# Patient Record
Sex: Male | Born: 1945 | Race: White | Hispanic: No | Marital: Married | Smoking: Current some day smoker
Health system: Southern US, Community
[De-identification: ages and names within clinical notes are randomized; demographics above are authoritative.]

## PROBLEM LIST (undated history)

## (undated) DIAGNOSIS — E119 Type 2 diabetes mellitus without complications: Secondary | ICD-10-CM

## (undated) DIAGNOSIS — E113299 Type 2 diabetes mellitus with mild nonproliferative diabetic retinopathy without macular edema, unspecified eye: Secondary | ICD-10-CM

## (undated) DIAGNOSIS — G4730 Sleep apnea, unspecified: Secondary | ICD-10-CM

## (undated) DIAGNOSIS — E7800 Pure hypercholesterolemia, unspecified: Secondary | ICD-10-CM

## (undated) DIAGNOSIS — M109 Gout, unspecified: Secondary | ICD-10-CM

## (undated) DIAGNOSIS — I10 Essential (primary) hypertension: Secondary | ICD-10-CM

## (undated) HISTORY — PX: HERNIA REPAIR: SHX51

---

## 2004-12-21 ENCOUNTER — Emergency Department: Payer: Self-pay | Admitting: Emergency Medicine

## 2006-08-28 ENCOUNTER — Emergency Department: Payer: Self-pay | Admitting: Emergency Medicine

## 2009-04-19 ENCOUNTER — Emergency Department: Payer: Self-pay | Admitting: Emergency Medicine

## 2011-05-24 ENCOUNTER — Emergency Department: Payer: Self-pay | Admitting: Emergency Medicine

## 2011-09-27 ENCOUNTER — Emergency Department: Payer: Self-pay | Admitting: Emergency Medicine

## 2011-09-27 LAB — URINALYSIS, COMPLETE
Bacteria: NONE SEEN
Glucose,UR: NEGATIVE mg/dL (ref 0–75)
Ketone: NEGATIVE
Leukocyte Esterase: NEGATIVE
Nitrite: NEGATIVE
Specific Gravity: 1.002 (ref 1.003–1.030)
Squamous Epithelial: NONE SEEN
WBC UR: 1 /HPF (ref 0–5)

## 2011-11-22 ENCOUNTER — Emergency Department: Payer: Self-pay | Admitting: Emergency Medicine

## 2011-11-22 LAB — COMPREHENSIVE METABOLIC PANEL
Albumin: 3.7 g/dL (ref 3.4–5.0)
Alkaline Phosphatase: 54 U/L (ref 50–136)
Anion Gap: 7 (ref 7–16)
BUN: 11 mg/dL (ref 7–18)
Bilirubin,Total: 0.6 mg/dL (ref 0.2–1.0)
EGFR (African American): >60
EGFR (Non-African Amer.): >60
Glucose: 176 mg/dL — ABNORMAL HIGH (ref 65–99)
Potassium: 4 mmol/L (ref 3.5–5.1)
SGOT(AST): 23 U/L (ref 15–37)
SGPT (ALT): 21 U/L
Sodium: 138 mmol/L (ref 136–145)
Total Protein: 8.1 g/dL (ref 6.4–8.2)

## 2011-11-22 LAB — URINALYSIS, COMPLETE
Bacteria: NONE SEEN
Blood: NEGATIVE
Leukocyte Esterase: NEGATIVE
Nitrite: NEGATIVE
Ph: 5 (ref 4.5–8.0)
RBC,UR: <1 /HPF (ref 0–5)
Specific Gravity: 1.019 (ref 1.003–1.030)
WBC UR: <1 /HPF (ref 0–5)

## 2011-11-22 LAB — CBC
HCT: 42.2 % (ref 40.0–52.0)
HGB: 14.4 g/dL (ref 13.0–18.0)
MCH: 31.2 pg (ref 26.0–34.0)
MCHC: 34 g/dL (ref 32.0–36.0)
MCV: 92 fL (ref 80–100)
Platelet: 107 10*3/uL — ABNORMAL LOW (ref 150–440)
RBC: 4.61 10*6/uL (ref 4.40–5.90)
RDW: 13.4 % (ref 11.5–14.5)
WBC: 7.8 10*3/uL (ref 3.8–10.6)

## 2015-12-25 ENCOUNTER — Encounter: Payer: Self-pay | Admitting: Emergency Medicine

## 2015-12-25 ENCOUNTER — Emergency Department
Admission: EM | Admit: 2015-12-25 | Discharge: 2015-12-25 | Disposition: A | Discharge status: Home / Self Care | Payer: Medicare Other | Attending: Student | Admitting: Student

## 2015-12-25 DIAGNOSIS — Y9389 Activity, other specified: Secondary | ICD-10-CM | POA: Insufficient documentation

## 2015-12-25 DIAGNOSIS — S3992XA Unspecified injury of lower back, initial encounter: Secondary | ICD-10-CM | POA: Diagnosis present

## 2015-12-25 DIAGNOSIS — I10 Essential (primary) hypertension: Secondary | ICD-10-CM | POA: Insufficient documentation

## 2015-12-25 DIAGNOSIS — Y929 Unspecified place or not applicable: Secondary | ICD-10-CM | POA: Insufficient documentation

## 2015-12-25 DIAGNOSIS — E119 Type 2 diabetes mellitus without complications: Secondary | ICD-10-CM | POA: Diagnosis not present

## 2015-12-25 DIAGNOSIS — Y999 Unspecified external cause status: Secondary | ICD-10-CM | POA: Diagnosis not present

## 2015-12-25 DIAGNOSIS — Z79899 Other long term (current) drug therapy: Secondary | ICD-10-CM | POA: Insufficient documentation

## 2015-12-25 DIAGNOSIS — Z7984 Long term (current) use of oral hypoglycemic drugs: Secondary | ICD-10-CM | POA: Diagnosis not present

## 2015-12-25 DIAGNOSIS — S39012A Strain of muscle, fascia and tendon of lower back, initial encounter: Secondary | ICD-10-CM | POA: Diagnosis not present

## 2015-12-25 DIAGNOSIS — X58XXXA Exposure to other specified factors, initial encounter: Secondary | ICD-10-CM | POA: Insufficient documentation

## 2015-12-25 HISTORY — DX: Pure hypercholesterolemia, unspecified: E78.00

## 2015-12-25 HISTORY — DX: Essential (primary) hypertension: I10

## 2015-12-25 HISTORY — DX: Gout, unspecified: M10.9

## 2015-12-25 HISTORY — DX: Type 2 diabetes mellitus without complications: E11.9

## 2015-12-25 MED ORDER — ETODOLAC 400 MG PO TABS: 400.0000 mg | ORAL_TABLET | Freq: Two times a day (BID) | ORAL | Status: DC

## 2015-12-25 MED ORDER — KETOROLAC TROMETHAMINE 60 MG/2ML IM SOLN
30.0000 mg | Freq: Once | INTRAMUSCULAR | Status: AC
  Administered 2015-12-25: 30 mg via INTRAMUSCULAR
  Filled 2015-12-25: qty 2

## 2015-12-25 MED ORDER — DIAZEPAM 2 MG PO TABS: 2.0000 mg | ORAL_TABLET | Freq: Three times a day (TID) | ORAL | Status: AC | PRN

## 2015-12-25 NOTE — ED Notes (Signed)
See triage note  States he has been bending over a truck to work on it   Developed lower back pain about 5 days ago  States pain is  Non radiating

## 2015-12-25 NOTE — Discharge Instructions (Signed)
Follow-up with your primary care doctor in [place] if you continue to have problems. Discontinue taking ibuprofen and aspirin at this time. Begin taking etodolac twice a day with food and diazepam every 8 hours as needed for muscle spasms. Do not drive while taking diazepam as it could cause drowsiness. You may use ice or heat to your low back to decrease pain.

## 2015-12-25 NOTE — ED Notes (Signed)
States he has been working on a truck and has been standing in a chair and bending over to reach it, since then having lower back pain.  Ambulates well.

## 2015-12-25 NOTE — ED Provider Notes (Signed)
[facility] Emergency Department Provider Note  ____________________________________________  Time seen: Approximately 7:56 AM  I have reviewed the triage vital signs and the nursing notes.   HISTORY  Chief Complaint Back Pain   HPI Gregory Miller is a 70 y.o. male is here complaining of low back pain. Patient states that last week he was working on a truck. Because this height he is too short to work on it just by bending over. Patient actually was standing on a chair and leaning over the truck while working. Patient denies any previous low back pain. He denies any injury to his back. He has been taking ibuprofen and aspirin with out any relief. He denies any radiation of his pain. Pain is not worse with cough, sneezing or movement. Patient denies being in incontinent of bowel or bladder.Currently patient rates his pain as 8 out of 10.   Past Medical History  Diagnosis Date  . Hypercholesteremia   . Diabetes mellitus without complication (HCC)   . Hypertension   . Gout     There are no active problems to display for this patient.   History reviewed. No pertinent past surgical history.  Current Outpatient Rx  Name  Route  Sig  Dispense  Refill  . colchicine 0.6 MG tablet   Oral   Take 0.6 mg by mouth daily.         . furosemide (LASIX) 20 MG tablet   Oral   Take 20 mg by mouth.         Marland Kitchen. glipiZIDE (GLUCOTROL) 5 MG tablet   Oral   Take 5 mg by mouth daily before breakfast.         . lisinopril (PRINIVIL,ZESTRIL) 2.5 MG tablet   Oral   Take 2.5 mg by mouth daily.         . metFORMIN (GLUCOPHAGE) 1000 MG tablet   Oral   Take 1,000 mg by mouth 2 (two) times daily with a meal.         . simvastatin (ZOCOR) 40 MG tablet   Oral   Take 40 mg by mouth daily.         . diazepam (VALIUM) 2 MG tablet   Oral   Take 1 tablet (2 mg total) by mouth every 8 (eight) hours as needed for muscle spasms.   9 tablet   0   . etodolac (LODINE)  400 MG tablet   Oral   Take 1 tablet (400 mg total) by mouth 2 (two) times daily.   14 tablet   0     Allergies Review of patient's allergies indicates no known allergies.  No family history on file.  Social History Social History  Substance Use Topics  . Smoking status: Never Smoker   . Smokeless tobacco: None  . Alcohol Use: None    Review of Systems Constitutional: No fever/chills Cardiovascular: Denies chest pain. Respiratory: Denies shortness of breath. Gastrointestinal: No abdominal pain.  No nausea, no vomiting.  Genitourinary: Negative for dysuria. Musculoskeletal: Positive back pain. Skin: Negative for rash. Neurological: Negative for headaches, focal weakness or numbness.  10-point ROS otherwise negative.  ____________________________________________   PHYSICAL EXAM:  VITAL SIGNS: ED Triage Vitals  Enc Vitals Group     BP 12/25/15 0718 144/75 mmHg     Pulse Rate 12/25/15 0718 89     Resp 12/25/15 0718 18     Temp 12/25/15 0718 98.1 F (36.7 C)     Temp Source 12/25/15  1191 Oral     SpO2 12/25/15 0718 95 %     Weight 12/25/15 0718 190 lb (86.183 kg)     Height 12/25/15 0718  (1.499 m)     Head Cir --      Peak Flow --      Pain Score 12/25/15 0711 8     Pain Loc --      Pain Edu? --      Excl. in GC? --     Constitutional: Alert and oriented. Well appearing and in no acute distress. Eyes: Conjunctivae are normal. PERRL. EOMI. Head: Atraumatic. Nose: No congestion/rhinnorhea. Neck: No stridor.   Cardiovascular: Normal rate, regular rhythm. Grossly normal heart sounds.  Good peripheral circulation. Respiratory: Normal respiratory effort.  No retractions. Lungs CTAB. Gastrointestinal: Soft and nontender. No distention.  No CVA tenderness. Musculoskeletal: Examination of lower back there is no gross deformity. There is moderate tenderness on palpation of the lumbar spine and paravertebral muscles but no actual vertebral tenderness is noted.  Range of motion is slightly restricted secondary to discomfort and mild muscle spasms. Normal gait was noted. Straight leg raises were 90 with out pain. Neurologic:  Normal speech and language. No gross focal neurologic deficits are appreciated. No gait instability. Reflexes were 2+ bilaterally. Skin:  Skin is warm, dry and intact. No rash noted. Psychiatric: Mood and affect are normal. Speech and behavior are normal.  ____________________________________________   LABS (all labs ordered are listed, but only abnormal results are displayed)  Labs Reviewed - No data to display   PROCEDURES  Procedure(s) performed: None  Critical Care performed: No  ____________________________________________   INITIAL IMPRESSION / ASSESSMENT AND PLAN / ED COURSE  Pertinent labs & imaging results that were available during my care of the patient were reviewed by me and considered in my medical decision making (see chart for details).  The shoulder was given 30 mg Toradol IM while in the emergency room. He was discharged with prescription for etodolac 400 milligrams twice a day with food and diazepam 2 mg every 8 hours as needed for muscle spasms. Patient was encouraged to use ice or heat to his back or possibly even alternating between the 2. He is follow-up with his primary care doctor in [place] if any continued problems. ____________________________________________   FINAL CLINICAL IMPRESSION(S) / ED DIAGNOSES  Final diagnoses:  Lumbar strain, initial encounter      Tommi Rumps, PA-C 12/25/15 4782  Gayla Doss, MD 12/25/15 [phone]

## 2017-01-31 ENCOUNTER — Emergency Department
Admission: EM | Admit: 2017-01-31 | Discharge: 2017-01-31 | Disposition: A | Discharge status: Home / Self Care | Payer: Medicare Other | Attending: Emergency Medicine | Admitting: Emergency Medicine

## 2017-01-31 ENCOUNTER — Encounter: Payer: Self-pay | Admitting: Emergency Medicine

## 2017-01-31 DIAGNOSIS — Z79899 Other long term (current) drug therapy: Secondary | ICD-10-CM | POA: Diagnosis not present

## 2017-01-31 DIAGNOSIS — N4829 Other inflammatory disorders of penis: Secondary | ICD-10-CM

## 2017-01-31 DIAGNOSIS — Z7984 Long term (current) use of oral hypoglycemic drugs: Secondary | ICD-10-CM | POA: Diagnosis not present

## 2017-01-31 DIAGNOSIS — I10 Essential (primary) hypertension: Secondary | ICD-10-CM | POA: Diagnosis not present

## 2017-01-31 DIAGNOSIS — E119 Type 2 diabetes mellitus without complications: Secondary | ICD-10-CM | POA: Diagnosis not present

## 2017-01-31 DIAGNOSIS — F17200 Nicotine dependence, unspecified, uncomplicated: Secondary | ICD-10-CM | POA: Insufficient documentation

## 2017-01-31 DIAGNOSIS — N477 Other inflammatory diseases of prepuce: Secondary | ICD-10-CM | POA: Diagnosis not present

## 2017-01-31 DIAGNOSIS — R109 Unspecified abdominal pain: Secondary | ICD-10-CM | POA: Diagnosis present

## 2017-01-31 NOTE — ED Triage Notes (Signed)
States unable to retract foreskin for washing since yesterday. Mild discomfort. No discharge.

## 2017-01-31 NOTE — ED Provider Notes (Signed)
[facility] Emergency Department Provider Note ____________________________________________  Time seen: 8:01 AM  I have reviewed the triage vital signs and the nursing notes.  HISTORY  Chief Complaint  Groin Pain   HPI Gregory Miller is a 71 y.o. male is here complaining of inability to retract foreskin to clean since yesterday. Patient states there is only mild discomfort and denies any urinary symptoms. He denies any discharge. He states that this occurred while he and his wife for "fooling around". He states this has never happened to him before. Patient's wife used some lidocaine gel to the area which seemed to help. Patient has continued to be able to urinate without any difficulty. Currently he rates his discomfort as 3/10.    Past Medical History:  Diagnosis Date  . Diabetes mellitus without complication (HCC)   . Gout   . Hypercholesteremia   . Hypertension     There are no active problems to display for this patient.   History reviewed. No pertinent surgical history.  Prior to Admission medications   Medication Sig Start Date End Date Taking? Authorizing Provider  colchicine 0.6 MG tablet Take 0.6 mg by mouth daily.    [provider]  diazepam (VALIUM) 2 MG tablet Take 1 tablet (2 mg total) by mouth every 8 (eight) hours as needed for muscle spasms. 12/25/15   Tommi Rumps, PA-C  etodolac (LODINE) 400 MG tablet Take 1 tablet (400 mg total) by mouth 2 (two) times daily. 12/25/15   Tommi Rumps, PA-C  furosemide (LASIX) 20 MG tablet Take 20 mg by mouth.    [provider]  glipiZIDE (GLUCOTROL) 5 MG tablet Take 5 mg by mouth daily before breakfast.    [provider]  lisinopril (PRINIVIL,ZESTRIL) 2.5 MG tablet Take 2.5 mg by mouth daily.    [provider]  metFORMIN (GLUCOPHAGE) 1000 MG tablet Take 1,000 mg by mouth 2 (two) times daily with a meal.    [provider]  simvastatin (ZOCOR) 40 MG  tablet Take 40 mg by mouth daily.    [provider]    Allergies Patient has no known allergies.  No family history on file.  Social History Social History  Substance Use Topics  . Smoking status: Current Some Day Smoker  . Smokeless tobacco: Never Used  . Alcohol use No    Review of Systems  Constitutional: Negative for fever. Cardiovascular: Negative for chest pain. Respiratory: Negative for shortness of breath. Gastrointestinal: Negative for abdominal pain Genitourinary: Negative for dysuria. Foreskin difficulties positive. Musculoskeletal: Negative for back pain. Skin: Negative for rash. Neurological: Negative for  focal weakness or numbness. ____________________________________________  PHYSICAL EXAM:  VITAL SIGNS: ED Triage Vitals  Enc Vitals Group     BP 01/31/17 0726 (!) 149/73     Pulse Rate 01/31/17 0726 94     Resp 01/31/17 0726 20     Temp 01/31/17 0726 98 F (36.7 C)     Temp Source 01/31/17 0726 Oral     SpO2 01/31/17 0726 96 %     Weight 01/31/17 0727 225 lb (102.1 kg)     Height 01/31/17 0727 4\' 11"  (1.499 m)     Head Circumference --      Peak Flow --      Pain Score 01/31/17 0726 3     Pain Loc --      Pain Edu? --      Excl. in GC? --  Constitutional: Alert and oriented. Well appearing and in no distress. Head: Normocephalic and atraumatic. Neck: Supple.  Hematological/Lymphatic/Immunological: No cervical lymphadenopathy. Cardiovascular: Normal rate, regular rhythm. Normal distal pulses. Respiratory: Normal respiratory effort.  Gastrointestinal: Soft and nontender. No distention. Genitourinary:  Distal portion of foreskin is edematous. Foreskin is able to retract enough to see the meatus. No drainage was noted. No necrotic areas were seen. This was also seen by Dr. Darnelle CatalanMalinda. Musculoskeletal: Moves upper and lower extremities without any difficulty. Normal gait was noted.  Neurologic:  Normal gait without ataxia. Normal speech  and language. No gross focal neurologic deficits are appreciated. Skin:  Skin is warm, dry and intact. No rash noted. Psychiatric: Mood and affect are normal. Patient exhibits appropriate insight and judgment.  INITIAL IMPRESSION / ASSESSMENT AND PLAN / ED COURSE  Patient is to allow 2 more days and will for swelling to completely dissipate. He is aware that should his condition worsen he is to return to the emergency room if any fever, increased pain, or inability to urinate. He is to follow-up with his PCP on Tuesday if needed.    ____________________________________________  FINAL CLINICAL IMPRESSION(S) / ED DIAGNOSES  Final diagnoses:  Foreskin inflammation     Tommi RumpsSummers, Rhonda L, PA-C 01/31/17 1425    Arnaldo NatalMalinda, Paul F, MD 01/31/17 1440

## 2017-01-31 NOTE — ED Notes (Signed)
See triage note  States he developed some swelling and pain at the end of penis  Was unable to pull back the foreskin  Wife placed some lidocaine ointment to area   Now end of penis is red and swollen

## 2017-01-31 NOTE — Discharge Instructions (Signed)
Call the  urologist on Tuesday if any continued problems. Return to the emergency room over the weekend if there is any drainage, redness, fever, increased pain or worsening of your condition.

## 2019-06-30 ENCOUNTER — Other Ambulatory Visit: Payer: Self-pay

## 2019-06-30 ENCOUNTER — Emergency Department
Admission: EM | Admit: 2019-06-30 | Discharge: 2019-06-30 | Disposition: A | Discharge status: Home / Self Care | Payer: Medicare Other | Attending: Emergency Medicine | Admitting: Emergency Medicine

## 2019-06-30 ENCOUNTER — Encounter: Payer: Self-pay | Admitting: Emergency Medicine

## 2019-06-30 DIAGNOSIS — N489 Disorder of penis, unspecified: Secondary | ICD-10-CM | POA: Diagnosis present

## 2019-06-30 DIAGNOSIS — N485 Ulcer of penis: Secondary | ICD-10-CM

## 2019-06-30 DIAGNOSIS — N390 Urinary tract infection, site not specified: Secondary | ICD-10-CM | POA: Diagnosis not present

## 2019-06-30 DIAGNOSIS — Z79899 Other long term (current) drug therapy: Secondary | ICD-10-CM | POA: Diagnosis not present

## 2019-06-30 DIAGNOSIS — R319 Hematuria, unspecified: Secondary | ICD-10-CM | POA: Diagnosis not present

## 2019-06-30 DIAGNOSIS — E119 Type 2 diabetes mellitus without complications: Secondary | ICD-10-CM | POA: Diagnosis not present

## 2019-06-30 DIAGNOSIS — Z7984 Long term (current) use of oral hypoglycemic drugs: Secondary | ICD-10-CM | POA: Diagnosis not present

## 2019-06-30 DIAGNOSIS — I10 Essential (primary) hypertension: Secondary | ICD-10-CM | POA: Diagnosis not present

## 2019-06-30 LAB — URINALYSIS, COMPLETE (UACMP) WITH MICROSCOPIC
Bilirubin Urine: NEGATIVE
Glucose, UA: >=500 mg/dL — AB
Ketones, ur: NEGATIVE mg/dL
Nitrite: NEGATIVE
Protein, ur: NEGATIVE mg/dL
RBC / HPF: >50 RBC/hpf — ABNORMAL HIGH (ref 0–5)
Specific Gravity, Urine: 1.012 (ref 1.005–1.030)
WBC, UA: >50 WBC/hpf — ABNORMAL HIGH (ref 0–5)
pH: 5 (ref 5.0–8.0)

## 2019-06-30 MED ORDER — SULFAMETHOXAZOLE-TRIMETHOPRIM 800-160 MG PO TABS: 1.0000 | ORAL_TABLET | Freq: Two times a day (BID) | ORAL | 0 refills | Status: DC

## 2019-06-30 MED ORDER — LIDOCAINE HCL (PF) 1 % IJ SOLN
2.0000 mL | Freq: Once | INTRAMUSCULAR | Status: AC
  Administered 2019-06-30: 2 mL
  Filled 2019-06-30: qty 5

## 2019-06-30 MED ORDER — CEFTRIAXONE SODIUM 250 MG IJ SOLR
250.0000 mg | Freq: Once | INTRAMUSCULAR | Status: AC
  Administered 2019-06-30: 250 mg via INTRAMUSCULAR
  Filled 2019-06-30: qty 250

## 2019-06-30 NOTE — ED Triage Notes (Signed)
Patient reports a "white spot" to the tip of his penis x 2 weeks.  Patient states he can wipe it away but it comes back.  Patient reports some pain with issue.  Patient reports being uncircumcised.  Patient denies unusual penile discharge.

## 2019-06-30 NOTE — ED Provider Notes (Signed)
[facility] Emergency Department Provider Note   ____________________________________________   First MD Initiated Contact with Patient 06/30/19 1029     (approximate)  I have reviewed the triage vital signs and the nursing notes.   HISTORY  Chief Complaint Penis Pain    HPI Gregory Miller is a 73 y.o. male patient presents also lesion to the tip of his penis for 2 weeks.  Patient state he wipes the lesion it goes away but returns in 2 to 3 days.  Patient denies drainage.  Patient denies urethral discharge.  Patient states not sexually active.  Patient rates pain as a 5/10.  Patient described pain as "sore".  No palliative measure for complaint.         Past Medical History:  Diagnosis Date  . Diabetes mellitus without complication (HCC)   . Gout   . Hypercholesteremia   . Hypertension     There are no active problems to display for this patient.   History reviewed. No pertinent surgical history.  Prior to Admission medications   Medication Sig Start Date End Date Taking? Authorizing Provider  colchicine 0.6 MG tablet Take 0.6 mg by mouth daily.    [provider]  diazepam (VALIUM) 2 MG tablet Take 1 tablet (2 mg total) by mouth every 8 (eight) hours as needed for muscle spasms. 12/25/15   Tommi RumpsSummers, Rhonda L, PA-C  etodolac (LODINE) 400 MG tablet Take 1 tablet (400 mg total) by mouth 2 (two) times daily. 12/25/15   Tommi RumpsSummers, Rhonda L, PA-C  furosemide (LASIX) 20 MG tablet Take 20 mg by mouth.    [provider]  glipiZIDE (GLUCOTROL) 5 MG tablet Take 5 mg by mouth daily before breakfast.    [provider]  lisinopril (PRINIVIL,ZESTRIL) 2.5 MG tablet Take 2.5 mg by mouth daily.    [provider]  metFORMIN (GLUCOPHAGE) 1000 MG tablet Take 1,000 mg by mouth 2 (two) times daily with a meal.    [provider]  simvastatin (ZOCOR) 40 MG tablet Take 40 mg by mouth daily.    [provider]   sulfamethoxazole-trimethoprim (BACTRIM DS) 800-160 MG tablet Take 1 tablet by mouth 2 (two) times daily. 06/30/19   Joni ReiningSmith, Race Latour K, PA-C    Allergies Patient has no known allergies.  No family history on file.  Social History Social History   Tobacco Use  . Smoking status: Current Some Day Smoker  . Smokeless tobacco: Never Used  Substance Use Topics  . Alcohol use: No  . Drug use: No    Review of Systems Constitutional: No fever/chills Eyes: No visual changes. ENT: No sore throat. Cardiovascular: Denies chest pain. Respiratory: Denies shortness of breath. Gastrointestinal: No abdominal pain.  No nausea, no vomiting.  No diarrhea.  No constipation. Genitourinary: Negative for dysuria. Musculoskeletal: Negative for back pain. Skin: Negative for rash. Neurological: Negative for headaches, focal weakness or numbness. Endocrine:  Diabetes, gout, hyperlipidemia, and hypertension. ____________________________________________   PHYSICAL EXAM:  VITAL SIGNS: ED Triage Vitals  Enc Vitals Group     BP 06/30/19 1018 (!) 147/92     Pulse Rate 06/30/19 1043 78     Resp 06/30/19 1018 16     Temp 06/30/19 1018 98.6 F (37 C)     Temp Source 06/30/19 1018 Oral     SpO2 06/30/19 1018 97 %     Weight 06/30/19 1019 153 lb (69.4 kg)     Height 06/30/19 1019 4\' 11"  (1.499 m)  Head Circumference --      Peak Flow --      Pain Score 06/30/19 1019 5     Pain Loc --      Pain Edu? --      Excl. in GC? --     Constitutional: Alert and oriented. Well appearing and in no acute distress. Hematological/Lymphatic/Immunilogical: No cervical lymphadenopathy. Cardiovascular: Normal rate, regular rhythm. Grossly normal heart sounds.  Good peripheral circulation. Respiratory: Normal respiratory effort.  No retractions. Lungs CTAB. Musculoskeletal: No lower extremity tenderness nor edema.  No joint effusions. Neurologic:  Normal speech and language. No gross focal neurologic deficits are  appreciated. No gait instability. Skin:  Skin is warm, dry and intact.  Circumcised male with single ulcerative lesion on shaft of penis. Psychiatric: Mood and affect are normal. Speech and behavior are normal.  ____________________________________________   LABS (all labs ordered are listed, but only abnormal results are displayed)  Labs Reviewed  URINALYSIS, COMPLETE (UACMP) WITH MICROSCOPIC - Abnormal; Notable for the following components:      Result Value   Color, Urine YELLOW (*)    APPearance TURBID (*)    Glucose, UA >=500 (*)    Hgb urine dipstick SMALL (*)    Leukocytes,Ua LARGE (*)    RBC / HPF >50 (*)    WBC, UA >50 (*)    Bacteria, UA MANY (*)    All other components within normal limits  URINE CULTURE   ____________________________________________  EKG   ____________________________________________  RADIOLOGY  ED MD interpretation:    Official radiology report(s): No results found.  ____________________________________________   PROCEDURES  Procedure(s) performed (including Critical Care):  Procedures   ____________________________________________   INITIAL IMPRESSION / ASSESSMENT AND PLAN / ED COURSE  As part of my medical decision making, I reviewed the following data within the electronic MEDICAL RECORD NUMBER         AUTHOR HATLESTAD was evaluated in Emergency Department on 06/30/2019 for the symptoms described in the history of present illness. He was evaluated in the context of the global COVID-19 pandemic, which necessitated consideration that the patient might be at risk for infection with the SARS-CoV-2 virus that causes COVID-19. Institutional protocols and algorithms that pertain to the evaluation of patients at risk for COVID-19 are in a state of rapid change based on information released by regulatory bodies including the CDC and federal and state organizations. These policies and algorithms were followed during the patient's care in the ED.   Patient presents with a single ulcer lesion on penis for 2 weeks.  Patient denies sexual activity.  Patient here was consistent with UTI.  Concern for carcinoma.  Patient given prescription for Bactrim DS.  Patient with consulted urology for definitive evaluation.      ____________________________________________   FINAL CLINICAL IMPRESSION(S) / ED DIAGNOSES  Final diagnoses:  Penile ulcer  Urinary tract infection with hematuria, site unspecified     ED Discharge Orders         Ordered    sulfamethoxazole-trimethoprim (BACTRIM DS) 800-160 MG tablet  2 times daily,   Status:  Discontinued     06/30/19 1125    sulfamethoxazole-trimethoprim (BACTRIM DS) 800-160 MG tablet  2 times daily,   Status:  Discontinued     06/30/19 1126    sulfamethoxazole-trimethoprim (BACTRIM DS) 800-160 MG tablet  2 times daily     06/30/19 1127           Note:  This document was prepared using  [employer] education [employer] and may include unintentional dictation errors.    Sable Feil, PA-C 06/30/19 1130    Earleen Newport, MD 06/30/19 1328

## 2019-06-30 NOTE — Discharge Instructions (Signed)
Epithelial lesion needs further evaluation by urologist.

## 2019-07-01 LAB — URINE CULTURE: Special Requests: NORMAL

## 2020-01-08 ENCOUNTER — Emergency Department: Payer: Medicare HMO

## 2020-01-08 ENCOUNTER — Inpatient Hospital Stay
Admission: EM | Admit: 2020-01-08 | Discharge: 2020-01-12 | DRG: 637 | Disposition: A | Discharge status: Home Health | Payer: Medicare HMO | Attending: Internal Medicine | Admitting: Internal Medicine

## 2020-01-08 ENCOUNTER — Other Ambulatory Visit: Payer: Self-pay

## 2020-01-08 ENCOUNTER — Encounter: Payer: Self-pay | Admitting: Emergency Medicine

## 2020-01-08 DIAGNOSIS — E7800 Pure hypercholesterolemia, unspecified: Secondary | ICD-10-CM | POA: Diagnosis present

## 2020-01-08 DIAGNOSIS — M109 Gout, unspecified: Secondary | ICD-10-CM | POA: Diagnosis present

## 2020-01-08 DIAGNOSIS — R739 Hyperglycemia, unspecified: Secondary | ICD-10-CM | POA: Diagnosis present

## 2020-01-08 DIAGNOSIS — E860 Dehydration: Secondary | ICD-10-CM | POA: Diagnosis present

## 2020-01-08 DIAGNOSIS — Z23 Encounter for immunization: Secondary | ICD-10-CM

## 2020-01-08 DIAGNOSIS — R8281 Pyuria: Secondary | ICD-10-CM | POA: Diagnosis present

## 2020-01-08 DIAGNOSIS — Z20822 Contact with and (suspected) exposure to covid-19: Secondary | ICD-10-CM | POA: Diagnosis present

## 2020-01-08 DIAGNOSIS — N390 Urinary tract infection, site not specified: Secondary | ICD-10-CM

## 2020-01-08 DIAGNOSIS — I10 Essential (primary) hypertension: Secondary | ICD-10-CM | POA: Diagnosis present

## 2020-01-08 DIAGNOSIS — G9341 Metabolic encephalopathy: Secondary | ICD-10-CM | POA: Diagnosis present

## 2020-01-08 DIAGNOSIS — E871 Hypo-osmolality and hyponatremia: Secondary | ICD-10-CM | POA: Diagnosis present

## 2020-01-08 DIAGNOSIS — E1100 Type 2 diabetes mellitus with hyperosmolarity without nonketotic hyperglycemic-hyperosmolar coma (NKHHC): Principal | ICD-10-CM | POA: Diagnosis present

## 2020-01-08 DIAGNOSIS — Z7982 Long term (current) use of aspirin: Secondary | ICD-10-CM

## 2020-01-08 DIAGNOSIS — D696 Thrombocytopenia, unspecified: Secondary | ICD-10-CM | POA: Diagnosis not present

## 2020-01-08 DIAGNOSIS — F17290 Nicotine dependence, other tobacco product, uncomplicated: Secondary | ICD-10-CM | POA: Diagnosis present

## 2020-01-08 DIAGNOSIS — E1165 Type 2 diabetes mellitus with hyperglycemia: Secondary | ICD-10-CM | POA: Diagnosis present

## 2020-01-08 DIAGNOSIS — R296 Repeated falls: Secondary | ICD-10-CM | POA: Diagnosis present

## 2020-01-08 DIAGNOSIS — Z7984 Long term (current) use of oral hypoglycemic drugs: Secondary | ICD-10-CM

## 2020-01-08 LAB — BASIC METABOLIC PANEL
Anion gap: 8 (ref 5–15)
Anion gap: 8 (ref 5–15)
Anion gap: 6 (ref 5–15)
BUN: 11 mg/dL (ref 8–23)
BUN: 9 mg/dL (ref 8–23)
BUN: 11 mg/dL (ref 8–23)
CO2: 28 mmol/L (ref 22–32)
CO2: 26 mmol/L (ref 22–32)
CO2: 26 mmol/L (ref 22–32)
Calcium: 8.2 mg/dL — ABNORMAL LOW (ref 8.9–10.3)
Calcium: 8.3 mg/dL — ABNORMAL LOW (ref 8.9–10.3)
Calcium: 8.1 mg/dL — ABNORMAL LOW (ref 8.9–10.3)
Chloride: 93 mmol/L — ABNORMAL LOW (ref 98–111)
Chloride: 99 mmol/L (ref 98–111)
Chloride: 102 mmol/L (ref 98–111)
Creatinine, Ser: 0.81 mg/dL (ref 0.61–1.24)
Creatinine, Ser: 0.76 mg/dL (ref 0.61–1.24)
Creatinine, Ser: 0.68 mg/dL (ref 0.61–1.24)
GFR calc Af Amer: >60 mL/min (ref >60)
GFR calc Af Amer: >60 mL/min (ref >60)
GFR calc Af Amer: >60 mL/min (ref >60)
GFR calc non Af Amer: >60 mL/min (ref >60)
GFR calc non Af Amer: >60 mL/min (ref >60)
GFR calc non Af Amer: >60 mL/min (ref >60)
Glucose, Bld: 538 mg/dL (ref 70–99)
Glucose, Bld: 327 mg/dL — ABNORMAL HIGH (ref 70–99)
Glucose, Bld: 162 mg/dL — ABNORMAL HIGH (ref 70–99)
Potassium: 3.9 mmol/L (ref 3.5–5.1)
Potassium: 3.7 mmol/L (ref 3.5–5.1)
Potassium: 3.5 mmol/L (ref 3.5–5.1)
Sodium: 129 mmol/L — ABNORMAL LOW (ref 135–145)
Sodium: 133 mmol/L — ABNORMAL LOW (ref 135–145)
Sodium: 134 mmol/L — ABNORMAL LOW (ref 135–145)

## 2020-01-08 LAB — CBC
HCT: 37.1 % — ABNORMAL LOW (ref 39.0–52.0)
Hemoglobin: 13.4 g/dL (ref 13.0–17.0)
MCH: 31.7 pg (ref 26.0–34.0)
MCHC: 36.1 g/dL — ABNORMAL HIGH (ref 30.0–36.0)
MCV: 87.7 fL (ref 80.0–100.0)
Platelets: 116 10*3/uL — ABNORMAL LOW (ref 150–400)
RBC: 4.23 MIL/uL (ref 4.22–5.81)
RDW: 12.7 % (ref 11.5–15.5)
WBC: 6.8 10*3/uL (ref 4.0–10.5)
nRBC: 0 % (ref 0.0–0.2)

## 2020-01-08 LAB — URINALYSIS, ROUTINE W REFLEX MICROSCOPIC
Bilirubin Urine: NEGATIVE
Glucose, UA: >=500 mg/dL — AB
Hgb urine dipstick: NEGATIVE
Ketones, ur: NEGATIVE mg/dL
Nitrite: NEGATIVE
Protein, ur: NEGATIVE mg/dL
Specific Gravity, Urine: 1.02 (ref 1.005–1.030)
WBC, UA: >50 WBC/hpf — ABNORMAL HIGH (ref 0–5)
pH: 6 (ref 5.0–8.0)

## 2020-01-08 LAB — URINE DRUG SCREEN, QUALITATIVE (ARMC ONLY)
Amphetamines, Ur Screen: NOT DETECTED
Barbiturates, Ur Screen: NOT DETECTED
Benzodiazepine, Ur Scrn: NOT DETECTED
Cannabinoid 50 Ng, Ur ~~LOC~~: NOT DETECTED
Cocaine Metabolite,Ur ~~LOC~~: NOT DETECTED
MDMA (Ecstasy)Ur Screen: NOT DETECTED
Methadone Scn, Ur: NOT DETECTED
Opiate, Ur Screen: NOT DETECTED
Phencyclidine (PCP) Ur S: NOT DETECTED
Tricyclic, Ur Screen: NOT DETECTED

## 2020-01-08 LAB — DIFFERENTIAL
Abs Immature Granulocytes: 0.02 10*3/uL (ref 0.00–0.07)
Basophils Absolute: 0 10*3/uL (ref 0.0–0.1)
Basophils Relative: 0 %
Eosinophils Absolute: 0.1 10*3/uL (ref 0.0–0.5)
Eosinophils Relative: 2 %
Immature Granulocytes: 0 %
Lymphocytes Relative: 23 %
Lymphs Abs: 1.6 10*3/uL (ref 0.7–4.0)
Monocytes Absolute: 0.3 10*3/uL (ref 0.1–1.0)
Monocytes Relative: 5 %
Neutro Abs: 4.7 10*3/uL (ref 1.7–7.7)
Neutrophils Relative %: 70 %

## 2020-01-08 LAB — COMPREHENSIVE METABOLIC PANEL
ALT: 18 U/L (ref 0–44)
AST: 23 U/L (ref 15–41)
Albumin: 3.2 g/dL — ABNORMAL LOW (ref 3.5–5.0)
Alkaline Phosphatase: 109 U/L (ref 38–126)
Anion gap: 11 (ref 5–15)
BUN: 12 mg/dL (ref 8–23)
CO2: 25 mmol/L (ref 22–32)
Calcium: 8.7 mg/dL — ABNORMAL LOW (ref 8.9–10.3)
Chloride: 90 mmol/L — ABNORMAL LOW (ref 98–111)
Creatinine, Ser: 0.89 mg/dL (ref 0.61–1.24)
GFR calc Af Amer: >60 mL/min (ref >60)
GFR calc non Af Amer: >60 mL/min (ref >60)
Glucose, Bld: 660 mg/dL (ref 70–99)
Potassium: 4.2 mmol/L (ref 3.5–5.1)
Sodium: 126 mmol/L — ABNORMAL LOW (ref 135–145)
Total Bilirubin: 1.5 mg/dL — ABNORMAL HIGH (ref 0.3–1.2)
Total Protein: 7.6 g/dL (ref 6.5–8.1)

## 2020-01-08 LAB — APTT: aPTT: 28 seconds (ref 24–36)

## 2020-01-08 LAB — GLUCOSE, CAPILLARY
Glucose-Capillary: 154 mg/dL — ABNORMAL HIGH (ref 70–99)
Glucose-Capillary: 179 mg/dL — ABNORMAL HIGH (ref 70–99)
Glucose-Capillary: 199 mg/dL — ABNORMAL HIGH (ref 70–99)
Glucose-Capillary: 299 mg/dL — ABNORMAL HIGH (ref 70–99)
Glucose-Capillary: 300 mg/dL — ABNORMAL HIGH (ref 70–99)
Glucose-Capillary: 427 mg/dL — ABNORMAL HIGH (ref 70–99)
Glucose-Capillary: 551 mg/dL (ref 70–99)

## 2020-01-08 LAB — MRSA PCR SCREENING: MRSA by PCR: NEGATIVE

## 2020-01-08 LAB — RESPIRATORY PANEL BY RT PCR (FLU A&B, COVID)
Influenza A by PCR: NEGATIVE
Influenza B by PCR: NEGATIVE
SARS Coronavirus 2 by RT PCR: NEGATIVE

## 2020-01-08 LAB — OSMOLALITY: Osmolality: 299 mOsm/kg — ABNORMAL HIGH (ref 275–295)

## 2020-01-08 LAB — ETHANOL: Alcohol, Ethyl (B): <10 mg/dL (ref <10)

## 2020-01-08 LAB — HEMOGLOBIN A1C
Hgb A1c MFr Bld: 12.9 % — ABNORMAL HIGH (ref 4.8–5.6)
Mean Plasma Glucose: 323.53 mg/dL

## 2020-01-08 LAB — PROTIME-INR
INR: 1.1 (ref 0.8–1.2)
Prothrombin Time: 13.7 seconds (ref 11.4–15.2)

## 2020-01-08 MED ORDER — INSULIN REGULAR(HUMAN) IN NACL 100-0.9 UT/100ML-% IV SOLN
INTRAVENOUS | Status: DC
  Administered 2020-01-08: 14:00:00 14 [IU]/h via INTRAVENOUS
  Filled 2020-01-08: qty 100

## 2020-01-08 MED ORDER — COLCHICINE 0.6 MG PO TABS
0.6000 mg | ORAL_TABLET | Freq: Every day | ORAL | Status: DC
  Administered 2020-01-08 – 2020-01-12 (×5): 0.6 mg via ORAL
  Filled 2020-01-08 (×5): qty 1

## 2020-01-08 MED ORDER — SODIUM CHLORIDE 0.9 % IV SOLN: INTRAVENOUS | Status: DC

## 2020-01-08 MED ORDER — PNEUMOCOCCAL VAC POLYVALENT 25 MCG/0.5ML IJ INJ
0.5000 mL | INJECTION | INTRAMUSCULAR | Status: AC
  Administered 2020-01-09: 0.5 mL via INTRAMUSCULAR
  Filled 2020-01-08: qty 0.5

## 2020-01-08 MED ORDER — SIMVASTATIN 20 MG PO TABS
40.0000 mg | ORAL_TABLET | Freq: Every day | ORAL | Status: DC
  Administered 2020-01-08 – 2020-01-12 (×5): 40 mg via ORAL
  Filled 2020-01-08 (×5): qty 2

## 2020-01-08 MED ORDER — SODIUM CHLORIDE 0.9 % IV SOLN
1.0000 g | Freq: Once | INTRAVENOUS | Status: AC
  Administered 2020-01-08: 12:00:00 1 g via INTRAVENOUS
  Filled 2020-01-08: qty 10

## 2020-01-08 MED ORDER — SODIUM CHLORIDE 0.9% FLUSH
3.0000 mL | Freq: Two times a day (BID) | INTRAVENOUS | Status: DC
  Administered 2020-01-09 – 2020-01-12 (×6): 3 mL via INTRAVENOUS

## 2020-01-08 MED ORDER — INSULIN GLARGINE 100 UNIT/ML ~~LOC~~ SOLN
10.0000 [IU] | Freq: Every evening | SUBCUTANEOUS | Status: DC
  Administered 2020-01-08: 10 [IU] via SUBCUTANEOUS
  Filled 2020-01-08 (×2): qty 0.1

## 2020-01-08 MED ORDER — POLYETHYLENE GLYCOL 3350 17 G PO PACK: 17.0000 g | PACK | Freq: Every day | ORAL | Status: DC | PRN

## 2020-01-08 MED ORDER — SODIUM CHLORIDE 0.9 % IV BOLUS
1000.0000 mL | INTRAVENOUS | Status: AC
  Administered 2020-01-08: 1000 mL via INTRAVENOUS

## 2020-01-08 MED ORDER — POTASSIUM CHLORIDE 10 MEQ/100ML IV SOLN
10.0000 meq | INTRAVENOUS | Status: AC
  Administered 2020-01-08 (×2): 10 meq via INTRAVENOUS
  Filled 2020-01-08 (×2): qty 100

## 2020-01-08 MED ORDER — ONDANSETRON HCL 4 MG PO TABS: 4.0000 mg | ORAL_TABLET | Freq: Four times a day (QID) | ORAL | Status: DC | PRN

## 2020-01-08 MED ORDER — SODIUM CHLORIDE 0.9 % IV SOLN
1000.0000 mL | Freq: Once | INTRAVENOUS | Status: AC
  Administered 2020-01-08: 12:00:00 1000 mL via INTRAVENOUS

## 2020-01-08 MED ORDER — INSULIN ASPART 100 UNIT/ML ~~LOC~~ SOLN
3.0000 [IU] | Freq: Three times a day (TID) | SUBCUTANEOUS | Status: DC
  Administered 2020-01-09 – 2020-01-10 (×4): 3 [IU] via SUBCUTANEOUS
  Filled 2020-01-08 (×3): qty 1

## 2020-01-08 MED ORDER — DEXTROSE-NACL 5-0.45 % IV SOLN: INTRAVENOUS | Status: DC

## 2020-01-08 MED ORDER — ASPIRIN EC 81 MG PO TBEC
81.0000 mg | DELAYED_RELEASE_TABLET | Freq: Every day | ORAL | Status: DC
  Administered 2020-01-08 – 2020-01-12 (×5): 81 mg via ORAL
  Filled 2020-01-08 (×5): qty 1

## 2020-01-08 MED ORDER — ACETAMINOPHEN 325 MG PO TABS: 650.0000 mg | ORAL_TABLET | Freq: Four times a day (QID) | ORAL | Status: DC | PRN

## 2020-01-08 MED ORDER — TAMSULOSIN HCL 0.4 MG PO CAPS
0.4000 mg | ORAL_CAPSULE | Freq: Every day | ORAL | Status: DC
  Administered 2020-01-08 – 2020-01-11 (×4): 0.4 mg via ORAL
  Filled 2020-01-08 (×4): qty 1

## 2020-01-08 MED ORDER — ACETAMINOPHEN 650 MG RE SUPP: 650.0000 mg | Freq: Four times a day (QID) | RECTAL | Status: DC | PRN

## 2020-01-08 MED ORDER — CHLORHEXIDINE GLUCONATE CLOTH 2 % EX PADS
6.0000 | MEDICATED_PAD | Freq: Every day | CUTANEOUS | Status: DC
  Administered 2020-01-08: 14:00:00 6 via TOPICAL

## 2020-01-08 MED ORDER — DEXTROSE 50 % IV SOLN: 0.0000 mL | INTRAVENOUS | Status: DC | PRN

## 2020-01-08 MED ORDER — ONDANSETRON HCL 4 MG/2ML IJ SOLN: 4.0000 mg | Freq: Four times a day (QID) | INTRAMUSCULAR | Status: DC | PRN

## 2020-01-08 MED ORDER — INSULIN ASPART 100 UNIT/ML ~~LOC~~ SOLN
0.0000 [IU] | Freq: Three times a day (TID) | SUBCUTANEOUS | Status: DC
  Administered 2020-01-09: 08:00:00 7 [IU] via SUBCUTANEOUS
  Filled 2020-01-08: qty 1

## 2020-01-08 NOTE — ED Triage Notes (Signed)
Pt has had multiple falls and slurred speech for 2-3 days. Noted slurred speech.  Family reports pt seems drunk and off balance and seems to fall back. Did hit head. No blood thinners.

## 2020-01-08 NOTE — ED Notes (Addendum)
Pt reports feeling weak all over x 1 week, speech clear. Pt's wife reports he has been unsteady on his feet during this time and says he has not been taking his medications the last few days. Pt reports he has not eaten a lot recently but drinks liquids frequently. Wife reports pt has been urinating on himself for a while. PT A&Ox4 and in NAD.

## 2020-01-08 NOTE — ED Notes (Signed)
Pt stood by the side of the bed to use the urinal and needed assistance by this RN to stand without falling over. Pt very shaky and wobbly upon standing. Advised pt and wife to call this RN if he needs assistance to stand.

## 2020-01-08 NOTE — ED Notes (Signed)
Ok per pt to have saltine crackers per Dr. Cyril Loosen

## 2020-01-08 NOTE — ED Provider Notes (Signed)
[facility] Emergency Department Provider Note   ____________________________________________    I have reviewed the triage vital signs and the nursing notes.   HISTORY  Chief Complaint Weakness, confusion    HPI Gregory Miller is a 74 y.o. male with a history of diabetes, hypercholesterolemia, hypertension who presents with weakness, confusion, frequent falls which is been occurring over the last week.  No indication of significant trauma.  Denies fevers.  Some dysuria noted.  No back pain.  No abdominal pain nausea or vomiting.  Has not take anything for this.  Reports compliance with medications.  No weakness in the arms or legs.  Past Medical History:  Diagnosis Date  . Diabetes mellitus without complication (HCC)   . Gout   . Hypercholesteremia   . Hypertension     There are no problems to display for this patient.   History reviewed. No pertinent surgical history.  Prior to Admission medications   Medication Sig Start Date End Date Taking? Authorizing Provider  aspirin 81 MG EC tablet Take by mouth. 12/14/07  Yes [provider]  Cholecalciferol 50 MCG (2000 UT) TABS Take 2,000 Units by mouth daily.    Yes [provider]  diazepam (VALIUM) 2 MG tablet Take 1 tablet (2 mg total) by mouth every 8 (eight) hours as needed for muscle spasms. 12/25/15  Yes Bridget Hartshorn L, PA-C  furosemide (LASIX) 20 MG tablet Take 20 mg by mouth.   Yes [provider]  glipiZIDE (GLUCOTROL) 5 MG tablet Take 10 mg by mouth daily before breakfast.    Yes [provider]  lisinopril (PRINIVIL,ZESTRIL) 2.5 MG tablet Take 2.5 mg by mouth daily.   Yes [provider]  metFORMIN (GLUCOPHAGE) 1000 MG tablet Take 1,000 mg by mouth 2 (two) times daily with a meal.   Yes [provider]  simvastatin (ZOCOR) 40 MG tablet Take 40 mg by mouth daily.   Yes [provider]  tamsulosin (FLOMAX) 0.4 MG CAPS capsule Take  0.4 mg by mouth.   Yes [provider]  colchicine 0.6 MG tablet Take 0.6 mg by mouth daily.    [provider]     Allergies Patient has no known allergies.  History reviewed. No pertinent family history.  Social History Social History   Tobacco Use  . Smoking status: Current Some Day Smoker  . Smokeless tobacco: Never Used  Substance Use Topics  . Alcohol use: No  . Drug use: No    Review of Systems  Constitutional: No fever/chills Eyes: No visual changes.  ENT: No sore throat. Cardiovascular: Denies chest pain. Respiratory: Denies shortness of breath. Gastrointestinal: No abdominal pain.   Genitourinary: As above Musculoskeletal: Negative for back pain. Skin: Negative for rash. Neurological: Negative for headaches   ____________________________________________   PHYSICAL EXAM:  VITAL SIGNS: ED Triage Vitals  Enc Vitals Group     BP 01/08/20 0954 133/77     Pulse Rate 01/08/20 0954 89     Resp 01/08/20 0954 16     Temp 01/08/20 0954 98 F (36.7 C)     Temp Source 01/08/20 0954 Oral     SpO2 01/08/20 0954 93 %     Weight 01/08/20 0942 83.9 kg (185 lb)     Height 01/08/20 0942 1.499 m (4\' 11" )     Head Circumference --      Peak Flow --      Pain Score 01/08/20 0941 0  Pain Loc --      Pain Edu? --      Excl. in [place]? --     Constitutional: Alert and oriented.  Eyes: Conjunctivae are normal.  PERRLA Head: Atraumatic. Nose: No congestion/rhinnorhea. Mouth/Throat: Mucous membranes are dry Neck:  Painless ROM Cardiovascular: Normal rate, regular rhythm.Kermit Balo peripheral circulation. Respiratory: Normal respiratory effort.  No retractions. Lungs CTAB. Gastrointestinal: Soft and nontender. No distention.  No CVA tenderness.  Musculoskeletal: No lower extremity tenderness nor edema.  Warm and well perfused Neurologic:  Normal speech and language. No gross focal neurologic deficits are appreciated.  Moves all extremities well, cranial  nerves intact Skin:  Skin is warm, dry and intact. No rash noted. Psychiatric: Mood and affect are normal. Speech and behavior are normal.  ____________________________________________   LABS (all labs ordered are listed, but only abnormal results are displayed)  Labs Reviewed  CBC - Abnormal; Notable for the following components:      Result Value   HCT 37.1 (*)    MCHC 36.1 (*)    Platelets 116 (*)    All other components within normal limits  COMPREHENSIVE METABOLIC PANEL - Abnormal; Notable for the following components:   Sodium 126 (*)    Chloride 90 (*)    Glucose, Bld 660 (*)    Calcium 8.7 (*)    Albumin 3.2 (*)    Total Bilirubin 1.5 (*)    All other components within normal limits  URINALYSIS, ROUTINE W REFLEX MICROSCOPIC - Abnormal; Notable for the following components:   Color, Urine YELLOW (*)    APPearance HAZY (*)    Glucose, UA >=500 (*)    Leukocytes,Ua LARGE (*)    WBC, UA >50 (*)    Bacteria, UA MANY (*)    All other components within normal limits  URINE CULTURE  ETHANOL  PROTIME-INR  APTT  DIFFERENTIAL  URINE DRUG SCREEN, QUALITATIVE ([facility] ONLY)   ____________________________________________  EKG  ED ECG REPORT I, Lavonia Drafts, the attending physician, personally viewed and interpreted this ECG.  Date: 01/08/2020  Rhythm: normal sinus rhythm QRS Axis: normal Intervals: normal ST/T Wave abnormalities: normal Narrative Interpretation: no evidence of acute ischemia  ____________________________________________  RADIOLOGY  CT head without acute findings ____________________________________________   PROCEDURES  Procedure(s) performed: No  Procedures   Critical Care performed: No ____________________________________________   INITIAL IMPRESSION / ASSESSMENT AND PLAN / ED COURSE  Pertinent labs & imaging results that were available during my care of the patient were reviewed by me and considered in my medical decision making  (see chart for details).  Patient presents with reports of some confusion, weakness, frequent falls over the last week.  Differential includes subdural hematoma, CVA, metabolic cephalopathy, infection.  No evidence of significant trauma on exam.  Cranial nerves and neuro exam is reassuring.  CT scan does not demonstrate any subdural hematoma or epidural hematoma.  Lab work is notable for glucose of 660 with normal anion gap.  White blood cell count is normal.  Urinalysis is consistent with urinary tract infection, will start IV Rocephin.  Combination of elevated glucose and infection may be the underlying cause of his symptoms today.  Will discuss with the hospitalist.    ____________________________________________   FINAL CLINICAL IMPRESSION(S) / ED DIAGNOSES  Final diagnoses:  Hyperglycemia  Urinary tract infection without hematuria, site unspecified  Metabolic encephalopathy        Note:  This document was prepared using Dragon voice recognition software and may include  unintentional dictation errors.   Jene Every, MD 01/08/20 1214

## 2020-01-08 NOTE — H&P (Signed)
History and Physical  LADD CEN LKG:[id number] DOB: July 19, 1946 DOA: 01/08/2020  PCP: Noel Journey, MD   Chief Complaint: Falls at home  HPI:  74 year old man PMH diabetes mellitus type 2 presented to the emergency department with polyuria, polydipsia and multiple falls at home.  Work-up revealed hyperglycemia 660 with normal anion gap and possible UTI.  Referred for admission.  History obtained from the patient and his wife at bedside.  Patient is a diabetic on oral medications.  As of late he has had polyuria and polydipsia.  His appetite has been fine.  His wife notes he has become unsteady on his feet and has had several falls at home and is also has some urinary incontinence.  No specific aggravating or alleviating factors noted.  Given multiple falls, brought to the emergency department for further evaluation.  There is been a question of some confusion as well.  Chart review: . No outpatient records notable in the last year  ED Course: Treated with IV fluids and ceftriaxone  Review of Systems:  Negative for fever, visual changes, sore throat, rash, new muscle aches, chest pain, shortness of breath, dysuria, bleeding, vomiting, abdominal pain, diarrhea.  PMH  Diabetes mellitus type 2   Gout . Remainder reviewed in Epic  [place] . Hernia repair  Family history includes: . Unknown  Social History . No alcohol, no drugs, smokes cigars  Allergies .  Marland Kitchen Remainder reviewed in Epic  Meds include: . None listed  Physicial Exam   Vitals:  . Afebrile 98.2, 18, 85, 141/79  Constitutional:   . Appears calm and comfortable lying on stretcher in the emergency department Eyes:  . Irises appear unremarkable . Normal lids  ENMT:  . grossly normal hearing  . Lips appear normal Neck:  . neck appears normal . no thyromegaly Respiratory:  . CTA bilaterally, no w/r/r.  . Respiratory effort normal.  Cardiovascular:  . RRR, no m/r/g . No LE extremity edema   Abdomen:   . Soft, nontender, nondistended small infraumbilical vertical incision noted well-healed, no hepatomegaly Musculoskeletal:  . RUE, LUE, RLE, LLE   o strength somewhat diminished globally and tone normal, no atrophy, no abnormal movements o No tenderness, masses GU: Uncircumcised penis, scrotum and perineum appear unremarkable skin:  . Chronic skin changes bilateral lower extremities . palpation of skin: no induration or nodules Neurologic:  . Grossly nonfocal Psychiatric:  . Mental status o Mood, affect appropriate o Orientation to self, location, year  I have personally reviewed following labs and imaging studies  Labs:  Marland Kitchen Glucose 660, anion gap 11, total bilirubin 1.5 . Platelets 116, remainder CBC unremarkable . Urinalysis abnormal with greater than 50 WBC, 21-50 RBCs  Imaging studies:   CT head no acute abnormalities  Medical tests:   EKG independently reviewed: Sinus rhythm   ASSESSMENT/PLAN  Hyperglycemic hyperosmolar state with pseudohyponatremia, mild confusion --Bolus IV fluids then start insulin infusion --Check hemoglobin A1c --May need insulin on discharge.  Hold oral medications.  Multiple falls at home, suspected ataxia secondary to hyperglycemia --PT evaluation  Thrombocytopenia --Significance unclear.  Repeat CBC in a.m.  Pyuria, UTI --Check urine culture  DVT prophylaxis: SCDs Code Status: Full Family Communication: wife at bedside Consults called: none    Time spent: 60 minutes  Murray Hodgkins, MD  [facility] Direct contact: see [email or web address]  7PM-7AM contact night coverage as below   1. Check the care team in [facility] and look for a) attending/consulting [facility] provider listed and b) the [place]  team listed 2. Log into [email or web address] and use [place]'s universal password to access. If you do not have the password, please contact the hospital operator. 3. Locate the [facility] provider you are looking for under [facility] and page to a  number that you can be directly reached. 4. If you still have difficulty reaching the provider, please page the [facility] (Director on Call) for the Hospitalists listed on amion for assistance.  Severity of Illness: The appropriate patient status for this patient is OBSERVATION. Observation status is judged to be reasonable and necessary in order to provide the required intensity of service to ensure the patient's safety. The patient's presenting symptoms, physical exam findings, and initial radiographic and laboratory data in the context of their medical condition is felt to place them at decreased risk for further clinical deterioration. Furthermore, it is anticipated that the patient will be medically stable for discharge from the hospital within 2 midnights of admission. The following factors support the patient status of observation.   " The patient's presenting symptoms include polyuria, polydipsia, multiple falls at home. " The physical exam findings include mild confusion but able to answer questions correctly, poor hygiene. " The initial radiographic and laboratory data are notable for hyperglycemia and pyuria.    Status is: Observation  The patient remains OBS appropriate and will d/c before 2 midnights.  Dispo: The patient is from: Home              Anticipated d/c is to: Home              Anticipated d/c date is: 1 day              Patient currently is not medically stable to d/c.        01/08/2020, 1:58 PM   Principal Problem:   Hyperglycemia Active Problems:   Thrombocytopenia (HCC)   Acute lower UTI

## 2020-01-09 ENCOUNTER — Encounter: Payer: Self-pay | Admitting: Family Medicine

## 2020-01-09 DIAGNOSIS — R8281 Pyuria: Secondary | ICD-10-CM

## 2020-01-09 DIAGNOSIS — R739 Hyperglycemia, unspecified: Secondary | ICD-10-CM | POA: Diagnosis not present

## 2020-01-09 DIAGNOSIS — D696 Thrombocytopenia, unspecified: Secondary | ICD-10-CM | POA: Diagnosis not present

## 2020-01-09 LAB — GLUCOSE, CAPILLARY
Glucose-Capillary: 137 mg/dL — ABNORMAL HIGH (ref 70–99)
Glucose-Capillary: 147 mg/dL — ABNORMAL HIGH (ref 70–99)
Glucose-Capillary: 164 mg/dL — ABNORMAL HIGH (ref 70–99)
Glucose-Capillary: 180 mg/dL — ABNORMAL HIGH (ref 70–99)
Glucose-Capillary: 256 mg/dL — ABNORMAL HIGH (ref 70–99)
Glucose-Capillary: 297 mg/dL — ABNORMAL HIGH (ref 70–99)
Glucose-Capillary: 332 mg/dL — ABNORMAL HIGH (ref 70–99)
Glucose-Capillary: 378 mg/dL — ABNORMAL HIGH (ref 70–99)
Glucose-Capillary: 408 mg/dL — ABNORMAL HIGH (ref 70–99)
Glucose-Capillary: 542 mg/dL (ref 70–99)

## 2020-01-09 LAB — URINE CULTURE: Culture: <10000 — AB

## 2020-01-09 LAB — GLUCOSE, RANDOM: Glucose, Bld: 385 mg/dL — ABNORMAL HIGH (ref 70–99)

## 2020-01-09 MED ORDER — CHLORHEXIDINE GLUCONATE CLOTH 2 % EX PADS
6.0000 | MEDICATED_PAD | Freq: Every day | CUTANEOUS | Status: DC
  Administered 2020-01-11 – 2020-01-12 (×2): 6 via TOPICAL

## 2020-01-09 MED ORDER — INSULIN ASPART 100 UNIT/ML ~~LOC~~ SOLN
0.0000 [IU] | Freq: Every day | SUBCUTANEOUS | Status: DC
  Administered 2020-01-11: 22:00:00 2 [IU] via SUBCUTANEOUS
  Filled 2020-01-09 (×2): qty 1

## 2020-01-09 MED ORDER — INSULIN GLARGINE 100 UNIT/ML ~~LOC~~ SOLN
30.0000 [IU] | Freq: Every day | SUBCUTANEOUS | Status: DC
  Administered 2020-01-09: 10:00:00 30 [IU] via SUBCUTANEOUS
  Filled 2020-01-09 (×2): qty 0.3

## 2020-01-09 MED ORDER — INSULIN STARTER KIT- PEN NEEDLES (ENGLISH)
1.0000 | Freq: Once | Status: AC
  Administered 2020-01-09: 10:00:00 1
  Filled 2020-01-09: qty 1

## 2020-01-09 MED ORDER — INSULIN ASPART 100 UNIT/ML ~~LOC~~ SOLN
0.0000 [IU] | Freq: Every day | SUBCUTANEOUS | Status: DC
  Administered 2020-01-09: 22:00:00 5 [IU] via SUBCUTANEOUS

## 2020-01-09 MED ORDER — LIVING WELL WITH DIABETES BOOK
Freq: Once | Status: AC
  Filled 2020-01-09: qty 1

## 2020-01-09 MED ORDER — INSULIN ASPART 100 UNIT/ML ~~LOC~~ SOLN
0.0000 [IU] | Freq: Three times a day (TID) | SUBCUTANEOUS | Status: DC
  Administered 2020-01-09: 8 [IU] via SUBCUTANEOUS
  Administered 2020-01-09: 13:00:00 15 [IU] via SUBCUTANEOUS
  Administered 2020-01-10: 8 [IU] via SUBCUTANEOUS
  Administered 2020-01-10: 15 [IU] via SUBCUTANEOUS
  Administered 2020-01-10: 5 [IU] via SUBCUTANEOUS
  Administered 2020-01-11: 13:00:00 8 [IU] via SUBCUTANEOUS
  Administered 2020-01-11 (×2): 5 [IU] via SUBCUTANEOUS
  Filled 2020-01-09 (×8): qty 1

## 2020-01-09 MED ORDER — INSULIN ASPART 100 UNIT/ML ~~LOC~~ SOLN: 0.0000 [IU] | Freq: Three times a day (TID) | SUBCUTANEOUS | Status: DC

## 2020-01-09 NOTE — Progress Notes (Addendum)
Patient arrived to floor. Vital signs stable. Breathing is even and unlabored. No distress is noted. All safety measures are in place. Per Dr. Myriam Forehand, no need to cardiac telemetry monitoring, order to dc. Dr. Myriam Forehand acknowledges that blood sugar was in the 400s before lunch and confirms that it is okay to wait until dinner to check again. Will continue to monitor. All safety measures are in place.

## 2020-01-09 NOTE — TOC Initial Note (Signed)
Transition of Care [facility]) - Initial/Assessment Note    Patient Details  Name: Gregory Miller MRN: [id number] Date of Birth: 11/02/1945  Transition of Care [facility]) CM/SW Contact:    Allayne Butcher, RN Phone Number: 01/09/2020, 4:22 PM  Clinical Narrative:                 Patient is from home with his wife.  Patient placed under observation for hyperglycemia.  Wife brought patient in for frequent falls at home, his blood glucose was 660 in the emergency room.  Patient is sleeping soundly at this time, RNCM spoke with patient's wife over the phone.  Wife reports that patient is independent and drives.  Wife will try and make it up to the hospital tomorrow if she can find a ride- she does not drive.   RNCM will discuss home health services with patient tomorrow.   Expected Discharge Plan: Home w Home Health Services Barriers to Discharge: Continued Medical Work up   Patient Goals and CMS Choice Patient states their goals for this hospitalization and ongoing recovery are:: wife would like for the patient to get his blood sugar under control CMS Medicare.gov Compare Post Acute Care list provided to:: Patient Choice offered to / list presented to : Patient  Expected Discharge Plan and Services Expected Discharge Plan: Home w Home Health Services   Discharge Planning Services: CM Consult Post Acute Care Choice: Home Health Living arrangements for the past 2 months: Single Family Home                                      Prior Living Arrangements/Services Living arrangements for the past 2 months: Single Family Home Lives with:: Spouse Patient language and need for interpreter reviewed:: Yes Do you feel safe going back to the place where you live?: Yes      Need for Family Participation in Patient Care: Yes (Comment) Care giver support system in place?: Yes (comment) Current home services: DME(cane) Criminal Activity/Legal Involvement Pertinent to Current Situation/Hospitalization:  No - Comment as needed  Activities of Daily Living Home Assistive Devices/Equipment: None ADL Screening (condition at time of admission) Patient's cognitive ability adequate to safely complete daily activities?: Yes Is the patient deaf or have difficulty hearing?: No Does the patient have difficulty seeing, even when wearing glasses/contacts?: No Does the patient have difficulty concentrating, remembering, or making decisions?: No Patient able to express need for assistance with ADLs?: Yes Does the patient have difficulty dressing or bathing?: No Independently performs ADLs?: Yes (appropriate for developmental age) Does the patient have difficulty walking or climbing stairs?: No Weakness of Legs: None Weakness of Arms/Hands: None  Permission Sought/Granted Permission sought to share information with : Case Manager, Family Supports Permission granted to share information with : Yes, Verbal Permission Granted  Share Information with NAME: Dois Davenport     Permission granted to share info w Relationship: wife     Emotional Assessment Appearance:: Appears stated age Attitude/Demeanor/Rapport: Lethargic   Orientation: : Oriented to Self, Oriented to Place, Oriented to  Time, Oriented to Situation Alcohol / Substance Use: Not Applicable Psych Involvement: No (comment)  Admission diagnosis:  Metabolic encephalopathy [G93.41] Hyperglycemia [R73.9] Urinary tract infection without hematuria, site unspecified [N39.0] Patient Active Problem List   Diagnosis Date Noted  . Hyperglycemia 01/08/2020  . Thrombocytopenia (HCC) 01/08/2020  . Acute lower UTI 01/08/2020   PCP:  Lucita Ferrara,  MD Pharmacy:   [id number] [place], [id number]: [phone]: [phone]    Social Determinants of Health (SDOH) Interventions    Readmission Risk Interventions No flowsheet data found.

## 2020-01-09 NOTE — Care Management Obs Status (Signed)
MEDICARE OBSERVATION STATUS NOTIFICATION   Patient Details  Name: Gregory Miller MRN: [id number] Date of Birth: [birth year]   Medicare Observation Status Notification Given:  Yes    Allayne Butcher, RN 01/09/2020, 4:17 PM

## 2020-01-09 NOTE — Progress Notes (Signed)
Alerted Dr. Myriam Forehand that patient has some milky white discharge from penis that is seeping out, moderate amount. Surrounding area is very red and irritated looking. Patient states that it does not hurt.

## 2020-01-09 NOTE — Progress Notes (Signed)
Nurse tech took blood sugar before patient ate dinner, it was 278. Nurse confirmed the ID number on the glucometer matched the patient's Id number, insulin given accordingly. Result never got transferred into epic after docking it several times. Recheck blood sugar to make sure, 297, patient had just eaten. Will pass along to night shift

## 2020-01-09 NOTE — Progress Notes (Addendum)
Progress Note    Gregory Miller  JME:[id number] DOB: Jun 11, 1946  DOA: 01/08/2020 PCP: Noel Journey, MD      Brief Narrative:    Medical records reviewed and are as summarized below:  Gregory Miller is an 74 y.o. male       Assessment/Plan:   Principal Problem:   Hyperglycemia Active Problems:   Thrombocytopenia (HCC)   Pyuria   Type 2 diabetes mellitus with severe hyperglycemia/hyper osmolar hyperglycemic state: Glucose levels are still uncontrolled/very high.  Continue IV fluids overnight since patient is still dehydrated.  Treat with Lantus and NovoLog.  Monitor glucose levels closely and adjust insulin dose accordingly.  It is important to titrate insulin to get glucose levels to persistently <300 prior to discharge.  The importance of using long-term insulin glucose control was discussed.  Patient had initially agreed to insulin but later changed his mind when he met with the diabetic educator.  Abnormal urinalysis/pyuria: Urine culture showed insignificant growth.  No need for antibiotics at this time.  Falls at home: Patient will have to be evaluated by PT prior to discharge.  PT evaluation.  Hyponatremia: Improved  Thrombocytopenia: Repeat CBC tomorrow.  Body mass index is 28.68 kg/m.   Family Communication/Anticipated D/C date and plan/Code Status   DVT prophylaxis: SCDs Code Status: Full code Family Communication: Plan discussed with patient Disposition Plan:    Status is: Observation  The patient will require care spanning > 2 midnights and should be moved to inpatient because: Inpatient level of care appropriate due to severity of illness  Dispo: The patient is from: Home              Anticipated d/c is to: Home              Anticipated d/c date is: 1 day              Patient currently is not medically stable to d/c.           Subjective:   No complaints.  No dysuria, frequent urination, fever or chills.  No chest pain, shortness of  breath, palpitations or dizziness.  Objective:    Vitals:   01/09/20 0400 01/09/20 0500 01/09/20 0700 01/09/20 1417  BP: 123/65 117/66 (!) 108/94 116/84  Pulse: 85 95 83 91  Resp: 17 (!) 24 16   Temp: 98.3 F (36.8 C)  97.9 F (36.6 C) 97.8 F (36.6 C)  TempSrc:   Axillary Oral  SpO2: 94% 97% 96% 97%  Weight:  64.4 kg    Height:       No data found.   Intake/Output Summary (Last 24 hours) at 01/09/2020 1717 Last data filed at 01/09/2020 0800 Gross per 24 hour  Intake 1449.69 ml  Output 700 ml  Net 749.69 ml   Filed Weights   01/08/20 0942 01/08/20 1334 01/09/20 0500  Weight: 83.9 kg 66.4 kg 64.4 kg    Exam:  GEN: NAD SKIN: No rash EYES: EOMI ENT: MMM CV: RRR PULM: CTA B ABD: soft, ND, NT, +BS CNS: AAO x 3, non focal EXT: No edema or tenderness   Data Reviewed:   I have personally reviewed following labs and imaging studies:  Labs: Labs show the following:   Basic Metabolic Panel: Recent Labs  Lab 01/08/20 1000 01/08/20 1000 01/08/20 1403 01/08/20 1403 01/08/20 1659 01/08/20 2228 01/09/20 1245  NA 126*  --  129*  --  133* 134*  --   K 4.2   < >  3.9   < > 3.7 3.5  --   CL 90*  --  93*  --  99 102  --   CO2 25  --  28  --  26 26  --   GLUCOSE 660*  --  538*  --  327* 162* 385*  BUN 12  --  11  --  9 11  --   CREATININE 0.89  --  0.81  --  0.76 0.68  --   CALCIUM 8.7*  --  8.2*  --  8.3* 8.1*  --    < > = values in this interval not displayed.   GFR Estimated Creatinine Clearance: 63.3 mL/min (by C-G formula based on SCr of 0.68 mg/dL). Liver Function Tests: Recent Labs  Lab 01/08/20 1000  AST 23  ALT 18  ALKPHOS 109  BILITOT 1.5*  PROT 7.6  ALBUMIN 3.2*   No results for input(s): LIPASE, AMYLASE in the last 168 hours. No results for input(s): AMMONIA in the last 168 hours. Coagulation profile Recent Labs  Lab 01/08/20 1000  INR 1.1    CBC: Recent Labs  Lab 01/08/20 1000  WBC 6.8  NEUTROABS 4.7  HGB 13.4  HCT 37.1*  MCV  87.7  PLT 116*   Cardiac Enzymes: No results for input(s): CKTOTAL, CKMB, CKMBINDEX, TROPONINI in the last 168 hours. BNP (last 3 results) No results for input(s): PROBNP in the last 8760 hours. CBG: Recent Labs  Lab 01/08/20 2246 01/08/20 2347 01/09/20 0048 01/09/20 0719 01/09/20 1147  GLUCAP 137* 154* 164* 332* 408*   D-Dimer: No results for input(s): DDIMER in the last 72 hours. Hgb A1c: Recent Labs    01/08/20 1403  HGBA1C 12.9*   Lipid Profile: No results for input(s): CHOL, HDL, LDLCALC, TRIG, CHOLHDL, LDLDIRECT in the last 72 hours. Thyroid function studies: No results for input(s): TSH, T4TOTAL, T3FREE, THYROIDAB in the last 72 hours.  Invalid input(s): FREET3 Anemia work up: No results for input(s): VITAMINB12, FOLATE, FERRITIN, TIBC, IRON, RETICCTPCT in the last 72 hours. Sepsis Labs: Recent Labs  Lab 01/08/20 1000  WBC 6.8    Microbiology Recent Results (from the past 240 hour(s))  Urine culture     Status: Abnormal   Collection Time: 01/08/20  9:54 AM   Specimen: Urine, Random  Result Value Ref Range Status   Specimen Description   Final    URINE, RANDOM Performed at [facility], [address] [place], [place] [address]    Special Requests   Final    NONE Performed at [facility], [address], [place], [place] [address]    Culture (A)  Final    <10,000 COLONIES/mL INSIGNIFICANT GROWTH Performed at [facility] [address] [place], [place] [address]    Report Status 01/09/2020 FINAL  Final  Respiratory Panel by RT PCR (Flu A&B, Covid) - Nasopharyngeal Swab     Status: None   Collection Time: 01/08/20 12:24 PM   Specimen: Nasopharyngeal Swab  Result Value Ref Range Status   SARS Coronavirus 2 by RT PCR NEGATIVE NEGATIVE Final    Comment: (NOTE) SARS-CoV-2 target nucleic acids are NOT DETECTED. The SARS-CoV-2 RNA is generally detectable in upper respiratoy specimens during the acute phase of infection.  The lowest concentration of SARS-CoV-2 viral copies this assay can detect is 131 copies/mL. A negative result does not preclude SARS-Cov-2 infection and should not be used as the sole basis for treatment or other patient management decisions. A  negative result may occur with  improper specimen collection/handling, submission of specimen other than nasopharyngeal swab, presence of viral mutation(s) within the areas targeted by this assay, and inadequate number of viral copies (<131 copies/mL). A negative result must be combined with clinical observations, patient history, and epidemiological information. The expected result is Negative. Fact Sheet for Patients:  PinkCheek.be Fact Sheet for Healthcare Providers:  GravelBags.it This test is not yet ap proved or cleared by the [place] FDA and  has been authorized for detection and/or diagnosis of SARS-CoV-2 by FDA under an Emergency Use Authorization (EUA). This EUA will remain  in effect (meaning this test can be used) for the duration of the COVID-19 declaration under Section 564(b)(1) of the Act, 21 U.S.C. section 360bbb-3(b)(1), unless the authorization is terminated or revoked sooner.    Influenza A by PCR NEGATIVE NEGATIVE Final   Influenza B by PCR NEGATIVE NEGATIVE Final    Comment: (NOTE) The Xpert Xpress SARS-CoV-2/FLU/RSV assay is intended as an aid in  the diagnosis of influenza from Nasopharyngeal swab specimens and  should not be used as a sole basis for treatment. Nasal washings and  aspirates are unacceptable for Xpert Xpress SARS-CoV-2/FLU/RSV  testing. Fact Sheet for Patients: PinkCheek.be Fact Sheet for Healthcare Providers: GravelBags.it This test is not yet approved or cleared by the [place] FDA and  has been authorized for detection and/or diagnosis of SARS-CoV-2 by  FDA under an  Emergency Use Authorization (EUA). This EUA will remain  in effect (meaning this test can be used) for the duration of the  Covid-19 declaration under Section 564(b)(1) of the Act, 21  U.S.C. section 360bbb-3(b)(1), unless the authorization is  terminated or revoked. Performed at [facility], [address] [place], [place] [address]   MRSA PCR Screening     Status: None   Collection Time: 01/08/20  1:44 PM   Specimen: Nasopharyngeal  Result Value Ref Range Status   MRSA by PCR NEGATIVE NEGATIVE Final    Comment:        The GeneXpert MRSA Assay (FDA approved for NASAL specimens only), is one component of a comprehensive MRSA colonization surveillance program. It is not intended to diagnose MRSA infection nor to guide or monitor treatment for MRSA infections. Performed at [facility], [address] [place], [place] [address]     Procedures and diagnostic studies:  CT HEAD WO CONTRAST  Result Date: 01/08/2020 CLINICAL DATA:  Multiple falls and slurred speech 2-3 days. Possible stroke. EXAM: CT HEAD WITHOUT CONTRAST TECHNIQUE: Contiguous axial images were obtained from the base of the skull through the vertex without intravenous contrast. COMPARISON:  None. FINDINGS: Brain: Ventricles, cisterns and other CSF spaces are within normal. There is no mass, mass effect, shift of midline structures or acute hemorrhage. No evidence to suggest acute infarction. There is mild chronic ischemic microvascular disease and age related atrophic change. Vascular: No hyperdense vessel or unexpected calcification. Skull: Normal. Negative for fracture or focal lesion. Sinuses/Orbits: No acute finding. Other: None. IMPRESSION: 1.  No acute findings. 2. Mild chronic ischemic microvascular disease and mild age related atrophic change. Electronically Signed   By: Marin Olp M.D.   On: 01/08/2020 10:51    Medications:   . aspirin EC  81 mg Oral Daily  . Chlorhexidine Gluconate  Cloth  6 each Topical Daily  . colchicine  0.6 mg Oral Daily  . insulin aspart  0-15 Units Subcutaneous TID WC  . insulin aspart  0-5  Units Subcutaneous QHS  . insulin aspart  0-5 Units Subcutaneous QHS  . insulin aspart  3 Units Subcutaneous TID WC  . insulin glargine  30 Units Subcutaneous Daily  . simvastatin  40 mg Oral Daily  . sodium chloride flush  3 mL Intravenous Q12H  . tamsulosin  0.4 mg Oral QPC supper   Continuous Infusions: . sodium chloride Stopped (01/09/20 0209)  . sodium chloride 75 mL/hr at 01/09/20 1713  . dextrose 5 % and 0.45% NaCl Stopped (01/09/20 0602)     LOS: 0 days   Quintasia Theroux  [facility]     01/09/2020, 5:17 PM

## 2020-01-09 NOTE — Progress Notes (Addendum)
Inpatient Diabetes Program Recommendations  AACE/ADA: New Consensus Statement on Inpatient Glycemic Control   Target Ranges:  Prepandial:   less than 140 mg/dL      Peak postprandial:   less than 180 mg/dL (1-2 hours)      Critically ill patients:  140 - 180 mg/dL  Results for ASHLY, GOETHE (MRN [id number]) as of 01/09/2020 08:28  Ref. Range 01/09/2020 00:48 01/09/2020 07:19  Glucose-Capillary Latest Ref Range: 70 - 99 mg/dL 164 (H) 332 (H)   Results for JACOREY, DONAWAY (MRN [id number]) as of 01/09/2020 08:28  Ref. Range 01/08/2020 14:34 01/08/2020 15:03 01/08/2020 16:45 01/08/2020 17:43 01/08/2020 18:45 01/08/2020 20:44 01/08/2020 23:47  Glucose-Capillary Latest Ref Range: 70 - 99 mg/dL 551 (HH) 427 (H) 300 (H) 299 (H) 199 (H) 179 (H) 154 (H)  Results for CAIUS, SILBERNAGEL (MRN [id number]) as of 01/09/2020 08:28  Ref. Range 01/08/2020 14:03  Hemoglobin A1C Latest Ref Range: 4.8 - 5.6 % 12.9 (H)  Results for RODRIGO, MCGRANAHAN (MRN [id number]) as of 01/09/2020 08:28  Ref. Range 01/08/2020 10:00  Glucose Latest Ref Range: 70 - 99 mg/dL 660 (HH)   Review of Glycemic Control  Diabetes history: DM2 Outpatient Diabetes medications: Glipizide 10 mg QAM, Metformin 1000 mg BID Current orders for Inpatient glycemic control: Lantus 30 units daily, Novolog 3 units TID with meals, Novolog 0-15 units TID with meals, Novolog 0-5 units QHS  Inpatient Diabetes Program Recommendations:   HbgA1C:  A1C 12.9% on 01/08/20 indicating an average glucose of 324 mg/dl over the past 2-3 months. Anticipate patient will need insulin as an outpatient. Patient states that he does not want to take insulin at home.   NOTE:  Noted consult for Diabetes Coordinator. Chart reviewed. Patient's last office visit with PCP was on 01/25/19 (telephone visit) and last A1C noted in Care Everywhere was 8% on 10/25/18.   Noted initial glucose 660 mg/dl on 01/08/20. Patient was initially started on an insulin drip and has transitioned to SQ insulin. Patient received Lantus 10  units at 23:33 on 01/08/20 and fasting glucose 332 mg/dl today and Lantus was increased to 30 units daily and Novolog correction to moderate scale.    Spoke with patient about diabetes and home regimen for diabetes control. Patient reports being followed by PCP for diabetes management and states he last seen PCP in December 2020 (however, last note by PCP in chart was 01/25/19).  Patient states that he is taking Glipizide and Metformin consistently as prescribed but notes that he has not taken it the past 2-3 days.    Patient reports checking glucose 2 times per day and states that his glucose is usually 30, 25, 19.  When I asked if he experienced hypoglycemia with noted glucose values, patient stated "No. I do not have any issues with low blood sugars that I know of."  Patient states that his glucometer is very old and he would like to get a prescription for a new glucometer at time of discharge.   Asked about what normal glucose values should be and patient states that he does not know what a normal glucose should be.   Discussed A1C results (12.9% on 01/08/20 ) and explained that current A1C indicates an average glucose of 324 mg/dl over the past 2-3 months. Discussed glucose and A1C goals. Discussed importance of checking CBGs and maintaining good CBG control to prevent long-term and short-term complications. Explained how hyperglycemia leads to damage within blood vessels which lead to the common complications  seen with uncontrolled diabetes. Stressed to the patient the importance of improving glycemic control to prevent further complications from uncontrolled diabetes. Discussed impact of nutrition, exercise, stress, sickness, and medications on diabetes control.  Patient states that he follows a carb modified diet at home.  Inquired about taking insulin at home. Patient states he does not want to take insulin at home. Explained that initial glucose 660 mg/dl and with A1C of 12.9%, he will most likely need  insulin to keep DM controlled and prevent further complications from uncontrolled DM. Patient stated "I just can't do it. I can't take insulin." Patient is willing to take other oral DM medications if prescribed at discharge. Patient states that he will call his PCP and make a follow up appointment regarding DM control. Encouraged patient to think about using insulin as an outpatient especially since glucose is back up to 408 mg/dl before lunch today.  Patient has Living Well with DM book and an insulin starter kit at bedside. Encouraged patient to read over the information and I would follow up with him tomorrow.  Patient verbalized understanding of information discussed and reports no further questions at this time related to diabetes.  Thanks, Barnie Alderman, RN, MSN, CDE Diabetes Coordinator Inpatient Diabetes Program [phone] (Team Pager from 8am to 5pm)

## 2020-01-10 DIAGNOSIS — E871 Hypo-osmolality and hyponatremia: Secondary | ICD-10-CM | POA: Diagnosis present

## 2020-01-10 DIAGNOSIS — E1165 Type 2 diabetes mellitus with hyperglycemia: Secondary | ICD-10-CM | POA: Diagnosis present

## 2020-01-10 DIAGNOSIS — Z7982 Long term (current) use of aspirin: Secondary | ICD-10-CM | POA: Diagnosis not present

## 2020-01-10 DIAGNOSIS — R296 Repeated falls: Secondary | ICD-10-CM | POA: Diagnosis present

## 2020-01-10 DIAGNOSIS — F17290 Nicotine dependence, other tobacco product, uncomplicated: Secondary | ICD-10-CM | POA: Diagnosis present

## 2020-01-10 DIAGNOSIS — E1100 Type 2 diabetes mellitus with hyperosmolarity without nonketotic hyperglycemic-hyperosmolar coma (NKHHC): Secondary | ICD-10-CM | POA: Diagnosis present

## 2020-01-10 DIAGNOSIS — Z20822 Contact with and (suspected) exposure to covid-19: Secondary | ICD-10-CM | POA: Diagnosis present

## 2020-01-10 DIAGNOSIS — M109 Gout, unspecified: Secondary | ICD-10-CM | POA: Diagnosis present

## 2020-01-10 DIAGNOSIS — R8281 Pyuria: Secondary | ICD-10-CM | POA: Diagnosis present

## 2020-01-10 DIAGNOSIS — E860 Dehydration: Secondary | ICD-10-CM | POA: Diagnosis present

## 2020-01-10 DIAGNOSIS — G9341 Metabolic encephalopathy: Secondary | ICD-10-CM | POA: Diagnosis present

## 2020-01-10 DIAGNOSIS — Z7984 Long term (current) use of oral hypoglycemic drugs: Secondary | ICD-10-CM | POA: Diagnosis not present

## 2020-01-10 DIAGNOSIS — E7800 Pure hypercholesterolemia, unspecified: Secondary | ICD-10-CM | POA: Diagnosis present

## 2020-01-10 DIAGNOSIS — R739 Hyperglycemia, unspecified: Secondary | ICD-10-CM | POA: Diagnosis not present

## 2020-01-10 DIAGNOSIS — I10 Essential (primary) hypertension: Secondary | ICD-10-CM | POA: Diagnosis present

## 2020-01-10 DIAGNOSIS — Z23 Encounter for immunization: Secondary | ICD-10-CM | POA: Diagnosis present

## 2020-01-10 DIAGNOSIS — D696 Thrombocytopenia, unspecified: Secondary | ICD-10-CM | POA: Diagnosis present

## 2020-01-10 LAB — BASIC METABOLIC PANEL
Anion gap: 7 (ref 5–15)
BUN: 12 mg/dL (ref 8–23)
CO2: 21 mmol/L — ABNORMAL LOW (ref 22–32)
Calcium: 8.1 mg/dL — ABNORMAL LOW (ref 8.9–10.3)
Chloride: 107 mmol/L (ref 98–111)
Creatinine, Ser: 0.63 mg/dL (ref 0.61–1.24)
GFR calc Af Amer: >60 mL/min (ref >60)
GFR calc non Af Amer: >60 mL/min (ref >60)
Glucose, Bld: 271 mg/dL — ABNORMAL HIGH (ref 70–99)
Potassium: 3.7 mmol/L (ref 3.5–5.1)
Sodium: 135 mmol/L (ref 135–145)

## 2020-01-10 LAB — URINALYSIS, COMPLETE (UACMP) WITH MICROSCOPIC
Bilirubin Urine: NEGATIVE
Glucose, UA: >=500 mg/dL — AB
Ketones, ur: NEGATIVE mg/dL
Nitrite: NEGATIVE
Protein, ur: NEGATIVE mg/dL
Specific Gravity, Urine: 1.011 (ref 1.005–1.030)
pH: 5 (ref 5.0–8.0)

## 2020-01-10 LAB — CHLAMYDIA/NGC RT PCR (ARMC ONLY)
Chlamydia Tr: NOT DETECTED
N gonorrhoeae: NOT DETECTED

## 2020-01-10 LAB — GLUCOSE, CAPILLARY
Glucose-Capillary: 278 mg/dL — ABNORMAL HIGH (ref 70–99)
Glucose-Capillary: 290 mg/dL — ABNORMAL HIGH (ref 70–99)
Glucose-Capillary: 361 mg/dL — ABNORMAL HIGH (ref 70–99)
Glucose-Capillary: 235 mg/dL — ABNORMAL HIGH (ref 70–99)

## 2020-01-10 MED ORDER — INSULIN GLARGINE 100 UNIT/ML ~~LOC~~ SOLN
35.0000 [IU] | Freq: Every day | SUBCUTANEOUS | Status: DC
  Administered 2020-01-10: 10:00:00 35 [IU] via SUBCUTANEOUS
  Filled 2020-01-10 (×2): qty 0.35

## 2020-01-10 MED ORDER — SODIUM CHLORIDE 0.9 % IV SOLN
1.0000 g | INTRAVENOUS | Status: DC
  Administered 2020-01-10 – 2020-01-11 (×2): 1 g via INTRAVENOUS
  Filled 2020-01-10: qty 10
  Filled 2020-01-10 (×2): qty 1

## 2020-01-10 MED ORDER — INSULIN ASPART 100 UNIT/ML ~~LOC~~ SOLN
5.0000 [IU] | Freq: Three times a day (TID) | SUBCUTANEOUS | Status: DC
  Administered 2020-01-10: 12:00:00 2 [IU] via SUBCUTANEOUS
  Administered 2020-01-10 – 2020-01-11 (×2): 5 [IU] via SUBCUTANEOUS
  Filled 2020-01-10: qty 1

## 2020-01-10 NOTE — Evaluation (Signed)
Occupational Therapy Evaluation Patient Details Name: Gregory Miller MRN: [id number] DOB: [birth year] Today's Date: 01/10/2020    History of Present Illness Gregory Miller is a 83yoM who comes to [facility] after several days slurred speech, instability of gait, family reports he appears drunk. Pt noted to have elevated BG 600s upon entry.   Clinical Impression   Gregory Miller was seen for OT evaluation this date. Prior to hospital admission, pt was MOD I c SPC c supervision from wife for bathing/LBD and assist for IADLs. Pt presents to acute OT demonstrating impaired ADL performance, functional cognition, and functional mobility 2/2 decreased safety awareness, poor insight into deficits, bladder incontinence, and functional strength/ROM/balance deficits. Pt currently requires MIN A toileting using urinal standing at EOB - asisst for positioning urinal, proper termination, and balance. MIN A LBD seated EOB - assist for balance in sitting. Pt would benefit from skilled OT to address noted impairments and functional limitations (see below for any additional details) in order to maximize safety and independence while minimizing falls risk and caregiver burden. Upon hospital discharge, recommend HHOT to maximize pt safety and return to functional independence during meaningful occupations of daily life.     Follow Up Recommendations  Home health OT;Supervision/Assistance - 24 hour    Equipment Recommendations  3 in 1 bedside commode    Recommendations for Other Services       Precautions / Restrictions Precautions Precautions: Fall Restrictions Weight Bearing Restrictions: No      Mobility Bed Mobility Overal bed mobility: Needs Assistance Bed Mobility: Supine to Sit;Sit to Supine     Supine to sit: Supervision Sit to supine: Supervision      Transfers Overall transfer level: Needs assistance Equipment used: Rolling walker (2 wheeled) Transfers: Sit to/from Stand Sit to Stand: Min guard          General transfer comment: Pt requires VCs for safety and to decrease impulsive movements    Balance Overall balance assessment: Needs assistance;History of Falls Sitting-balance support: No upper extremity supported Sitting balance-Leahy Scale: Good     Standing balance support: Single extremity supported Standing balance-Leahy Scale: Good                             ADL either performed or assessed with clinical judgement   ADL Overall ADL's : Needs assistance/impaired                                       General ADL Comments: Pt requires MIN A toileting using urinal standing at EOB - asisst for positioning urinal and proper termination. MIN A LBD seated EOB - assist for balance in sitting      Vision   Vision Assessment?: Yes Tracking/Visual Pursuits: Requires cues, head turns, or add eye shifts to track;Decreased smoothness of horizontal tracking     Perception     Praxis      Pertinent Vitals/Pain Pain Assessment: No/denies pain     Hand Dominance Right   Extremity/Trunk Assessment Upper Extremity Assessment Upper Extremity Assessment: Generalized weakness(difficulty c finger to nose)   Lower Extremity Assessment Lower Extremity Assessment: Generalized weakness       Communication Communication Communication: HOH   Cognition Arousal/Alertness: Awake/alert Behavior During Therapy: WFL for tasks assessed/performed Overall Cognitive Status: Difficult to assess Area of Impairment: Safety/judgement;Following commands;Orientation;Problem solving  Orientation Level: Disoriented to;Situation     Following Commands: Follows one step commands inconsistently(requires reprtition of commands) Safety/Judgement: Decreased awareness of safety;Decreased awareness of deficits   Problem Solving: Decreased initiation;Difficulty sequencing;Requires tactile cues;Requires verbal cues;Slow processing General Comments: Pt  did not state he had wet the bed until wife/OT noticed and asked. Pt did not state need to urinate until asked (RN reports have been attempting to obtain urine sample and pt has not stated need to go). Per wife, pt has hx of urinary incontinence   General Comments       Exercises Exercises: Other exercises Other Exercises Other Exercises: Pt and caregiver educated re: falls prevention, energy conservation, DME recs, d/c recs, home/routine modification, RW technique Other Exercises: Toileting, bed mobility, sup<>sit, sit<>stand x2, side stepping at EOB   Shoulder Instructions      Home Living Family/patient expects to be discharged to:: Private residence Living Arrangements: Spouse/significant other Available Help at Discharge: Family Type of Home: Mobile home Home Access: Level entry     Home Layout: One level     Bathroom Shower/Tub: Chief Strategy Officer: Standard     Home Equipment: Cane - single point          Prior Functioning/Environment Level of Independence: Needs assistance    ADL's / Homemaking Assistance Needed: Wife reports pt requires assist for IADLs and Supervision for bathing/LBD            OT Problem List: Decreased strength;Decreased range of motion;Impaired balance (sitting and/or standing);Impaired vision/perception;Decreased safety awareness;Decreased knowledge of use of DME or AE      OT Treatment/Interventions: Self-care/ADL training;Therapeutic exercise;Energy conservation;Neuromuscular education;DME and/or AE instruction;Therapeutic activities;Cognitive remediation/compensation;Visual/perceptual remediation/compensation;Balance training;Patient/family education    OT Goals(Current goals can be found in the care plan section) Acute Rehab OT Goals Patient Stated Goal: be safe with moblity at home OT Goal Formulation: With patient/family Time For Goal Achievement: 01/24/20 Potential to Achieve Goals: Good ADL Goals Pt Will  Perform Grooming: with supervision;standing(c LRAD PRN) Pt Will Perform Lower Body Dressing: with supervision;sit to/from stand(c LRAD PRN) Pt Will Transfer to Toilet: with supervision;ambulating;regular height toilet(c LRAD PRN) Pt Will Perform Toileting - Clothing Manipulation and hygiene: with supervision;sitting/lateral leans(c LRAD PRN)  OT Frequency: Min 2X/week   Barriers to D/C: Decreased caregiver support;Inaccessible home environment          Co-evaluation              AM-PAC OT "6 Clicks" Daily Activity     Outcome Measure Help from another person eating meals?: A Little Help from another person taking care of personal grooming?: A Little Help from another person toileting, which includes using toliet, bedpan, or urinal?: A Little Help from another person bathing (including washing, rinsing, drying)?: A Little Help from another person to put on and taking off regular upper body clothing?: A Little Help from another person to put on and taking off regular lower body clothing?: A Little 6 Click Score: 18   End of Session Equipment Utilized During Treatment: Rolling walker  Activity Tolerance: Patient tolerated treatment well Patient left: in bed;with call bell/phone within reach;with bed alarm set;with family/visitor present;with SCD's reapplied  OT Visit Diagnosis: Other abnormalities of gait and mobility (R26.89);Muscle weakness (generalized) (M62.81);History of falling (Z91.81)                Time: 3299-2426 OT Time Calculation (min): 28 min Charges:  OT General Charges $OT Visit: 1 Visit OT Evaluation $OT Eval Moderate Complexity:  1 Mod OT Treatments $Self Care/Home Management : 23-37 mins  Kathie Dike, M.S. OTR/L  01/10/20, 2:56 PM

## 2020-01-10 NOTE — Progress Notes (Addendum)
Progress Note    Gregory Miller  ZYS:[id number] DOB: 16-Feb-1946  DOA: 01/08/2020 PCP: Noel Journey, MD      Brief Narrative:    Medical records reviewed and are as summarized below:  Gregory Miller is an 74 y.o. male       Assessment/Plan:   Principal Problem:   Hyperglycemia Active Problems:   Thrombocytopenia (HCC)   Pyuria   Type 2 diabetes mellitus with hyperglycemia (HCC)   Type 2 diabetes mellitus with severe hyperglycemia/hyper osmolar hyperglycemic state: Glucose levels are still uncontrolled/very high. Continue with Lantus and NovoLog.  Monitor glucose levels closely and adjust insulin dose accordingly. It is important to titrate insulin to get glucose levels to persistently <300 prior to discharge.  The importance of using long-term insulin glucose control was discussed.  Patient has decided against the use of long-term insulin.  His wife was also at the bedside and she said she is not comfortable with insulin.  They understand that oral hypoglycemics may not be adequate for glucose control.  Abnormal urinalysis/pyuria: Urine culture from urinalysis on 01/08/2020 showed insignificant growth.  Purulent looking no milky discharge from penis, patient will be restarted on IV antibiotics.  Repeat urinalysis.  Check gonococcal and chlamydia PCR.  This was discussed with patient and his wife at the bedside.  Falls at home: Recommended home health at discharge.    Hyponatremia: Resolved.  Thrombocytopenia: Repeat CBC .  Body mass index is 28.68 kg/m.   Family Communication/Anticipated D/C date and plan/Code Status   DVT prophylaxis: SCDs Code Status: Full code Family Communication: Plan discussed with his wife at the bedside Disposition Plan:   Status is: Inpatient  Remains inpatient appropriate because:Unsafe d/c plan and IV treatments appropriate due to intensity of illness or inability to take PO   Dispo: The patient is from: Home              Anticipated  d/c is to: Home              Anticipated d/c date is: 1 day              Patient currently is not medically stable to d/c.                   Subjective:   Overnight events noted.  Patient has purulent-looking/milky discharge from his penis.  No dysuria or hematuria.  No fever or chills.  Patient said he is not sexually active.  Objective:    Vitals:   01/09/20 0700 01/09/20 1417 01/09/20 2356 01/10/20 0735  BP: (!) 108/94 116/84 (!) 113/56 116/68  Pulse: 83 91 94 87  Resp: 16  16 16   Temp: 97.9 F (36.6 C) 97.8 F (36.6 C) 98.4 F (36.9 C) 98.9 F (37.2 C)  TempSrc: Axillary Oral Oral Oral  SpO2: 96% 97% 94% 94%  Weight:      Height:       No data found.   Intake/Output Summary (Last 24 hours) at 01/10/2020 1156 Last data filed at 01/10/2020 0900 Gross per 24 hour  Intake 552.59 ml  Output 200 ml  Net 352.59 ml   Filed Weights   01/08/20 0942 01/08/20 1334 01/09/20 0500  Weight: 83.9 kg 66.4 kg 64.4 kg    Exam:  GEN: NAD SKIN: Erythematous maculopapular rash in bilateral groin and around the penis. EYES: No pallor or icterus ENT: MMM CV: RRR PULM: CTA B ABD: soft, ND, NT, +BS  CNS:  AAO x 3, non focal EXT: No edema or tenderness GU: Milky or creamy thick discharge noted from the penis/urethra.  No CVA tenderness   Data Reviewed:   I have personally reviewed following labs and imaging studies:  Labs: Labs show the following:   Basic Metabolic Panel: Recent Labs  Lab 01/08/20 1000 01/08/20 1000 01/08/20 1403 01/08/20 1403 01/08/20 1659 01/08/20 1659 01/08/20 2228 01/09/20 1245 01/10/20 0605  NA 126*  --  129*  --  133*  --  134*  --  135  K 4.2   < > 3.9   < > 3.7   < > 3.5  --  3.7  CL 90*  --  93*  --  99  --  102  --  107  CO2 25  --  28  --  26  --  26  --  21*  GLUCOSE 660*   < > 538*  --  327*  --  162* 385* 271*  BUN 12  --  11  --  9  --  11  --  12  CREATININE 0.89  --  0.81  --  0.76  --  0.68  --  0.63  CALCIUM 8.7*   --  8.2*  --  8.3*  --  8.1*  --  8.1*   < > = values in this interval not displayed.   GFR Estimated Creatinine Clearance: 63.3 mL/min (by C-G formula based on SCr of 0.63 mg/dL). Liver Function Tests: Recent Labs  Lab 01/08/20 1000  AST 23  ALT 18  ALKPHOS 109  BILITOT 1.5*  PROT 7.6  ALBUMIN 3.2*   No results for input(s): LIPASE, AMYLASE in the last 168 hours. No results for input(s): AMMONIA in the last 168 hours. Coagulation profile Recent Labs  Lab 01/08/20 1000  INR 1.1    CBC: Recent Labs  Lab 01/08/20 1000  WBC 6.8  NEUTROABS 4.7  HGB 13.4  HCT 37.1*  MCV 87.7  PLT 116*   Cardiac Enzymes: No results for input(s): CKTOTAL, CKMB, CKMBINDEX, TROPONINI in the last 168 hours. BNP (last 3 results) No results for input(s): PROBNP in the last 8760 hours. CBG: Recent Labs  Lab 01/09/20 1706 01/09/20 1922 01/09/20 2116 01/10/20 0833 01/10/20 1130  GLUCAP 278* 297* 256* 290* 361*   D-Dimer: No results for input(s): DDIMER in the last 72 hours. Hgb A1c: Recent Labs    01/08/20 1403  HGBA1C 12.9*   Lipid Profile: No results for input(s): CHOL, HDL, LDLCALC, TRIG, CHOLHDL, LDLDIRECT in the last 72 hours. Thyroid function studies: No results for input(s): TSH, T4TOTAL, T3FREE, THYROIDAB in the last 72 hours.  Invalid input(s): FREET3 Anemia work up: No results for input(s): VITAMINB12, FOLATE, FERRITIN, TIBC, IRON, RETICCTPCT in the last 72 hours. Sepsis Labs: Recent Labs  Lab 01/08/20 1000  WBC 6.8    Microbiology Recent Results (from the past 240 hour(s))  Urine culture     Status: Abnormal   Collection Time: 01/08/20  9:54 AM   Specimen: Urine, Random  Result Value Ref Range Status   Specimen Description   Final    URINE, RANDOM Performed at [facility], [address] [place], [place] [address]    Special Requests   Final    NONE Performed at [facility], [address] [place], [place] [address]    Culture  (A)  Final    <10,000 COLONIES/mL INSIGNIFICANT GROWTH Performed at [facility], [address], [place], [place] [address]    Report Status 01/09/2020 FINAL  Final  Respiratory Panel by RT PCR (Flu A&B, Covid) - Nasopharyngeal Swab     Status: None   Collection Time: 01/08/20 12:24 PM   Specimen: Nasopharyngeal Swab  Result Value Ref Range Status   SARS Coronavirus 2 by RT PCR NEGATIVE NEGATIVE Final    Comment: (NOTE) SARS-CoV-2 target nucleic acids are NOT DETECTED. The SARS-CoV-2 RNA is generally detectable in upper respiratoy specimens during the acute phase of infection. The lowest concentration of SARS-CoV-2 viral copies this assay can detect is 131 copies/mL. A negative result does not preclude SARS-Cov-2 infection and should not be used as the sole basis for treatment or other patient management decisions. A negative result may occur with  improper specimen collection/handling, submission of specimen other than nasopharyngeal swab, presence of viral mutation(s) within the areas targeted by this assay, and inadequate number of viral copies (<131 copies/mL). A negative result must be combined with clinical observations, patient history, and epidemiological information. The expected result is Negative. Fact Sheet for Patients:  https://www.moore.com/ Fact Sheet for Healthcare Providers:  https://www.young.biz/ This test is not yet ap proved or cleared by the [place] FDA and  has been authorized for detection and/or diagnosis of SARS-CoV-2 by FDA under an Emergency Use Authorization (EUA). This EUA will remain  in effect (meaning this test can be used) for the duration of the COVID-19 declaration under Section 564(b)(1) of the Act, 21 U.S.C. section 360bbb-3(b)(1), unless the authorization is terminated or revoked sooner.    Influenza A by PCR NEGATIVE NEGATIVE Final   Influenza B by PCR NEGATIVE NEGATIVE Final    Comment:  (NOTE) The Xpert Xpress SARS-CoV-2/FLU/RSV assay is intended as an aid in  the diagnosis of influenza from Nasopharyngeal swab specimens and  should not be used as a sole basis for treatment. Nasal washings and  aspirates are unacceptable for Xpert Xpress SARS-CoV-2/FLU/RSV  testing. Fact Sheet for Patients: https://www.moore.com/ Fact Sheet for Healthcare Providers: https://www.young.biz/ This test is not yet approved or cleared by the [place] FDA and  has been authorized for detection and/or diagnosis of SARS-CoV-2 by  FDA under an Emergency Use Authorization (EUA). This EUA will remain  in effect (meaning this test can be used) for the duration of the  Covid-19 declaration under Section 564(b)(1) of the Act, 21  U.S.C. section 360bbb-3(b)(1), unless the authorization is  terminated or revoked. Performed at [facility], [address] [place], [place] [address]   MRSA PCR Screening     Status: None   Collection Time: 01/08/20  1:44 PM   Specimen: Nasopharyngeal  Result Value Ref Range Status   MRSA by PCR NEGATIVE NEGATIVE Final    Comment:        The GeneXpert MRSA Assay (FDA approved for NASAL specimens only), is one component of a comprehensive MRSA colonization surveillance program. It is not intended to diagnose MRSA infection nor to guide or monitor treatment for MRSA infections. Performed at [facility], [address] [place], [place] [address]     Procedures and diagnostic studies:  No results found.  Medications:   . aspirin EC  81 mg Oral Daily  . Chlorhexidine Gluconate Cloth  6 each Topical Daily  . colchicine  0.6 mg Oral Daily  . insulin aspart  0-15 Units Subcutaneous TID WC  . insulin aspart  0-5 Units Subcutaneous QHS  . insulin aspart  3 Units Subcutaneous TID WC  .  insulin glargine  35 Units Subcutaneous Daily  . simvastatin  40 mg Oral Daily  . sodium chloride flush  3 mL  Intravenous Q12H  . tamsulosin  0.4 mg Oral QPC supper   Continuous Infusions: . sodium chloride Stopped (01/09/20 0209)  . sodium chloride 75 mL/hr at 01/10/20 1032  . cefTRIAXone (ROCEPHIN)  IV 1 g (01/10/20 0927)  . dextrose 5 % and 0.45% NaCl Stopped (01/09/20 0602)     LOS: 0 days   Veleria Barnhardt  [facility]     01/10/2020, 11:56 AM

## 2020-01-10 NOTE — Progress Notes (Signed)
Assumed care of patient at this time

## 2020-01-10 NOTE — Evaluation (Signed)
Physical Therapy Evaluation Patient Details Name: Gregory Miller MRN: [id number] DOB: 12/21/1945 Today's Date: 01/10/2020   History of Present Illness  Gregory Miller is a 73yoM who comes to [facility] after several days slurred speech, instability of gait, family reports he appears drunk. Pt noted to have elevated BG 600s upon entry.  Clinical Impression  Pt admitted with above diagnosis. Pt currently with functional limitations due to the deficits listed below (see "PT Problem List"). Upon entry, pt in bed, awake and agreeable to participate. Pt gesturing to IV which has been pulled out of Rt antebrachium, still dangling from arm by tape. Condom cath also pulled off, bed soaked with urine, gown soiled. Pt moves to EOB at supervision, cleaned up blood, new gown, linen stripped from bed. The pt is alert and oriented x4, pleasant, and generally a good historian, but does seem mildly confused at times, unclear if baseline. Pt minGuardA for unsteady rise to standing, but can stand unsupported without LOB with had movements. Gait appears consistent with cerebellar ataxia, wide based, loss of continuity. Pt able to AMB with RW at supervision level, encouraged to use AT ALL TIMES now and after DC. Wife also made aware of this. Seated exam shows abnormal finger to nose, abnormal RAM BUE, and mild dysmetria. Digital opposition with mildly slow and calculated, but patient is able to peel a banana and remove several phloem bundles via pincer gripping. Pt has clear cerebellar signs, but thus far imaging has been unremarkable. Pt denies ever having consumed ETOH in his life, denies any recent consumption. Functional mobility assessment demonstrates increased effort/time requirements, good tolerance, but no need for physical assistance, whereas the patient performed these at a higher level of independence PTA. Pt will benefit from skilled PT intervention to increase independence and safety with basic mobility in preparation for  discharge to the venue listed below.       Follow Up Recommendations Home health PT;Supervision for mobility/OOB    Equipment Recommendations  Rolling walker with 5" wheels    Recommendations for Other Services       Precautions / Restrictions Precautions Precautions: Fall Restrictions Weight Bearing Restrictions: No      Mobility  Bed Mobility Overal bed mobility: Needs Assistance Bed Mobility: Supine to Sit     Supine to sit: Supervision        Transfers Overall transfer level: Needs assistance Equipment used: 1 person hand held assist Transfers: Sit to/from Stand Sit to Stand: Min guard         General transfer comment: unsteady but slow and cautious; able to perform with modified indepence with RW later.  Ambulation/Gait Ambulation/Gait assistance: Min guard Gait Distance (Feet): 15 Feet Assistive device: None Gait Pattern/deviations: Ataxic;Wide base of support Gait velocity: 0.23m/s   General Gait Details: wide based ataxia, irregular cadence, compromised confidence; able to AMB 332ft with RW at minGuard asssit to supervision, cues for directions.  Stairs            Wheelchair Mobility    Modified Rankin (Stroke Patients Only)       Balance Overall balance assessment: Modified Independent;History of Falls;Needs assistance Sitting-balance support: No upper extremity supported Sitting balance-Leahy Scale: Normal     Standing balance support: No upper extremity supported Standing balance-Leahy Scale: Good Standing balance comment: can perform vert and hor headturns without LOB, but has cerebellar ataxia  Pertinent Vitals/Pain Pain Assessment: No/denies pain    Home Living Family/patient expects to be discharged to:: Private residence Living Arrangements: Spouse/significant other Available Help at Discharge: Family Type of Home: Mobile home Home Access: Level entry     Home Layout: One  level Home Equipment: Kasandra Knudsen - single point      Prior Function           Comments: 3 recent falls in the last few weeks.     Hand Dominance        Extremity/Trunk Assessment   Upper Extremity Assessment Upper Extremity Assessment: (poor performance with finger to nose accuracy, mild dysmetria c distal targets; RAM are altered.)            Communication      Cognition Arousal/Alertness: Awake/alert   Overall Cognitive Status: Difficult to assess Area of Impairment: Safety/judgement;Following commands;Orientation                 Orientation Level: Time;Person;Place([place], [place], [facility], Month, Year, Tuesday, (gives current time instead of day of month))     Following Commands: Follows one step commands consistently Safety/Judgement: (had pullied out his IV and off his condom cath upon entry.)            General Comments      Exercises     Assessment/Plan    PT Assessment Patient needs continued PT services  PT Problem List Decreased cognition;Decreased activity tolerance;Decreased balance;Decreased mobility       PT Treatment Interventions DME instruction;Gait training;Functional mobility training;Therapeutic activities;Therapeutic exercise;Patient/family education    PT Goals (Current goals can be found in the Care Plan section)  Acute Rehab PT Goals Patient Stated Goal: be safe with moblity at home PT Goal Formulation: With patient Time For Goal Achievement: 01/24/20 Potential to Achieve Goals: Good    Frequency 7X/week   Barriers to discharge        Co-evaluation               AM-PAC PT "6 Clicks" Mobility  Outcome Measure Help needed turning from your back to your side while in a flat bed without using bedrails?: A Little Help needed moving from lying on your back to sitting on the side of a flat bed without using bedrails?: A Little Help needed moving to and from a bed to a chair (including a wheelchair)?: A  Little Help needed standing up from a chair using your arms (e.g., wheelchair or bedside chair)?: A Little Help needed to walk in hospital room?: A Little Help needed climbing 3-5 steps with a railing? : A Little 6 Click Score: 18    End of Session Equipment Utilized During Treatment: Gait belt Activity Tolerance: Patient tolerated treatment well Patient left: in chair;with family/visitor present;with nursing/sitter in room;with call bell/phone within reach;with SCD's reapplied Nurse Communication: Mobility status PT Visit Diagnosis: Ataxic gait (R26.0);Other abnormalities of gait and mobility (R26.89)    Time: 4540-9811 PT Time Calculation (min) (ACUTE ONLY): 29 min   Charges:   PT Evaluation $PT Eval Low Complexity: 1 Low PT Treatments $Gait Training: 8-22 mins        11:09 AM, 01/10/20 Etta Grandchild, PT, DPT Physical Therapist - [facility]  [phone] ([place])    Ladue C 01/10/2020, 10:58 AM

## 2020-01-10 NOTE — Progress Notes (Addendum)
Inpatient Diabetes Program Recommendations  AACE/ADA: New Consensus Statement on Inpatient Glycemic Control   Target Ranges:  Prepandial:   less than 140 mg/dL      Peak postprandial:   less than 180 mg/dL (1-2 hours)      Critically ill patients:  140 - 180 mg/dL  Results for Gregory Miller, Gregory Miller (MRN [id number]) as of 01/10/2020 07:33  Ref. Range 01/10/2020 06:05  Glucose Latest Ref Range: 70 - 99 mg/dL 271 (H)   Results for Gregory Miller, Gregory Miller (MRN [id number]) as of 01/10/2020 07:33  Ref. Range 01/09/2020 07:19 01/09/2020 11:47 01/09/2020 19:22 01/09/2020 21:16  Glucose-Capillary Latest Ref Range: 70 - 99 mg/dL 332 (H) 408 (H) 297 (H) 256 (H)   Results for Gregory Miller, Gregory Miller (MRN [id number]) as of 01/10/2020 07:33  Ref. Range 01/08/2020 14:03  Hemoglobin A1C Latest Ref Range: 4.8 - 5.6 % 12.9 (H)   Review of Glycemic Control  Diabetes history: DM2 Outpatient Diabetes medications: Glipizide 10 mg QAM, Metformin 1000 mg BID Current orders for Inpatient glycemic control: Lantus 30 units daily, Novolog 3 units TID with meals, Novolog 0-15 units TID with meals, Novolog 0-5 units QHS  Inpatient Diabetes Program Recommendations:   Insulin-Basal: Please consider increasing Lantus to 35 units daily.  Insulin-Meal Coverage: Please consider increasing meal coverage to Novolog 8 units TID with meals.  Addendum 01/10/20@13 :20-Spoke with patient and his wife at bedside. Reviewed information discussed yesterday and patient was able to answer some of the questions asked. Encouraged patient and his wife to read the Living Well with DM book to get a better understanding of diabetes and why it is important to keep it controlled. When asked if patient was willing to take insulin, he stated "I will take it if I have to." Patient's wife stated "I don't want to hear that. I was hoping he wasn't going to need to take shots."  Discussed glucose trends with Lantus, Novolog meal coverage, and correction. Also reviewed A1C of 12.9% on 01/08/20 and  explained that if he is consistently taking oral DM medications prescribed that his average glucose was around 324 mg/dl. Discussed in basic terms how hyperglycemia damages blood vessels and nerve endings and leads to complications. Explained that his pancreas may not be able to make adequate amount of insulin to keep glucose controlled and that if pancreas cells get overworked and die out, they lose the ability to make insulin. Explained that he is most likely going to need insulin injections to keep glucose controlled. Discussed Lantus and Novolog insulin in detail (how they work, onset, and duration). Reviewed insulin starter kit with patient and asked patient to start keeping a log of glucose readings. Discussed hypoglycemia along with treatment and encouraged patient to purchase glucose tablets to have on hand to treat hypoglycemia if needed. Reviewed and demonstrated how to use vial/syringe and insulin pens. Patient states he would prefer to use insulin pens if he has to use insulin at home.   Reviewed all steps of insulin pen including attachment of needle, 2-unit air shot, dialing up dose, giving injection, removing needle, disposal of sharps, storage of unused insulin, disposal of insulin etc. Patient able to provide successful return demonstration with a few verbal cues. Patient's wife was also able to demonstrate how to use an insulin pen with a few verbal cues. Reminded patient and his wife that the insulin starter kit had step by step instructions on how to use the insulin pen. Patient's wife appeared upset about the possibility of patient  having to use insulin and stated "I don't want to have to deal with this shit. I am not use to it and I was just fine with the pills."  Provided emotional support and encouragement.  Patient's wife seems to have a poor understanding of why oral DM medications may not be sufficient to keep DM controlled or the potential complications from uncontrolled DM. Patient  stated that he would be okay with using insulin pens if he absolutely had to. Encouraged patient and his wife to read the Living Well with DM book and the insulin starter kit to enhance their knowledge about DM and insulin administration. Informed patient that I would ask RNs to allow him to self administer insulin while inpatient so he can practice self injecting while inpatient. Patient verbalized understanding of information discussed and he states that he has no questions at this time. Patient's wife stated she did not have any questions but she doesn't want to deal with insulin. Sent chat message to Dr. Mal Misty regarding conversation and education provided to patient and his wife.   Thanks, Barnie Alderman, RN, MSN, CDE Diabetes Coordinator Inpatient Diabetes Program [phone] (Team Pager from 8am to 5pm)

## 2020-01-10 NOTE — Progress Notes (Signed)
Having trouble getting urine sample as condomn cath does not fit him. Will continue to try to get sample.

## 2020-01-11 LAB — BASIC METABOLIC PANEL
Anion gap: 6 (ref 5–15)
BUN: 13 mg/dL (ref 8–23)
CO2: 25 mmol/L (ref 22–32)
Calcium: 8.5 mg/dL — ABNORMAL LOW (ref 8.9–10.3)
Chloride: 104 mmol/L (ref 98–111)
Creatinine, Ser: 0.64 mg/dL (ref 0.61–1.24)
GFR calc Af Amer: >60 mL/min (ref >60)
GFR calc non Af Amer: >60 mL/min (ref >60)
Glucose, Bld: 203 mg/dL — ABNORMAL HIGH (ref 70–99)
Potassium: 3.6 mmol/L (ref 3.5–5.1)
Sodium: 135 mmol/L (ref 135–145)

## 2020-01-11 LAB — GLUCOSE, CAPILLARY
Glucose-Capillary: 179 mg/dL — ABNORMAL HIGH (ref 70–99)
Glucose-Capillary: 241 mg/dL — ABNORMAL HIGH (ref 70–99)
Glucose-Capillary: 266 mg/dL — ABNORMAL HIGH (ref 70–99)
Glucose-Capillary: 218 mg/dL — ABNORMAL HIGH (ref 70–99)
Glucose-Capillary: 213 mg/dL — ABNORMAL HIGH (ref 70–99)

## 2020-01-11 LAB — URINE CULTURE: Culture: <10000 — AB

## 2020-01-11 MED ORDER — GLIPIZIDE 10 MG PO TABS
10.0000 mg | ORAL_TABLET | Freq: Two times a day (BID) | ORAL | Status: DC
  Administered 2020-01-12: 10 mg via ORAL
  Filled 2020-01-11: qty 1

## 2020-01-11 MED ORDER — GLIPIZIDE 10 MG PO TABS
10.0000 mg | ORAL_TABLET | Freq: Every day | ORAL | Status: DC
  Administered 2020-01-11: 10 mg via ORAL
  Filled 2020-01-11: qty 1

## 2020-01-11 MED ORDER — LINAGLIPTIN 5 MG PO TABS
5.0000 mg | ORAL_TABLET | Freq: Every day | ORAL | Status: DC
  Administered 2020-01-11 – 2020-01-12 (×2): 5 mg via ORAL
  Filled 2020-01-11 (×2): qty 1

## 2020-01-11 MED ORDER — INSULIN ASPART 100 UNIT/ML ~~LOC~~ SOLN: 8.0000 [IU] | Freq: Three times a day (TID) | SUBCUTANEOUS | Status: DC

## 2020-01-11 MED ORDER — INSULIN GLARGINE 100 UNIT/ML ~~LOC~~ SOLN
38.0000 [IU] | Freq: Every day | SUBCUTANEOUS | Status: DC
  Filled 2020-01-11: qty 0.38

## 2020-01-11 MED ORDER — INSULIN GLARGINE 100 UNIT/ML ~~LOC~~ SOLN
45.0000 [IU] | Freq: Every day | SUBCUTANEOUS | Status: DC
  Filled 2020-01-11 (×2): qty 0.45

## 2020-01-11 MED ORDER — INSULIN GLARGINE 100 UNIT/ML ~~LOC~~ SOLN: 45.0000 [IU] | Freq: Every day | SUBCUTANEOUS | Status: DC

## 2020-01-11 MED ORDER — METFORMIN HCL 500 MG PO TABS
1000.0000 mg | ORAL_TABLET | Freq: Two times a day (BID) | ORAL | Status: DC
  Administered 2020-01-11 – 2020-01-12 (×2): 1000 mg via ORAL
  Filled 2020-01-11 (×5): qty 2

## 2020-01-11 NOTE — Hospital Course (Signed)
74 year old man PMH diabetes mellitus type 2 presented to the emergency department with polyuria, polydipsia and multiple falls at home.  Work-up revealed hyperglycemia 660 with normal anion gap and possible UTI.  Referred for admission.  Patient takes only oral medications for diabetes, had increased polyuria and polydipsia for some time.  Brought to the ED due to unsteady gait and multiple falls at home.

## 2020-01-11 NOTE — Progress Notes (Addendum)
Inpatient Diabetes Program Recommendations  AACE/ADA: New Consensus Statement on Inpatient Glycemic Control   Target Ranges:  Prepandial:   less than 140 mg/dL      Peak postprandial:   less than 180 mg/dL (1-2 hours)      Critically ill patients:  140 - 180 mg/dL  Results for DUAN, SCHARNHORST (MRN [id number]) as of 01/11/2020 07:43  Ref. Range 01/11/2020 05:31  Glucose Latest Ref Range: 70 - 99 mg/dL 628 (H)   Results for VIAN, FLUEGEL (MRN [id number]) as of 01/11/2020 07:43  Ref. Range 01/10/2020 08:33 01/10/2020 11:30 01/10/2020 16:31 01/10/2020 21:28  Glucose-Capillary Latest Ref Range: 70 - 99 mg/dL 579 (H)  Novolog 8 units  Lantus 35 units 361 (H)  Novolog 20 units 235 (H)  Novolog 10 units 179 (H)   Review of Glycemic Control  Diabetes history:DM2 Outpatient Diabetes medications:Glipizide 10 mg QAM, Metformin 1000 mg BID Current orders for Inpatient glycemic control:Lantus35units daily, Novolog 5 units TID with meals, Novolog 0-15units TID with meals, Novolog 0-5 units QHS  Inpatient Diabetes Program Recommendations:   Insulin-Basal: Please consider increasing Lantus to 38 units daily.  Insulin-Meal Coverage: Please consider increasing meal coverage to Novolog 8 units TID with meals.  Thanks, Orlando Penner, RN, MSN, CDE Diabetes Coordinator Inpatient Diabetes Program [phone] (Team Pager from 8am to 5pm)

## 2020-01-11 NOTE — Progress Notes (Signed)
PROGRESS NOTE    Gregory Miller   XTK:[id number]  DOB: [birth year]  PCP: Lucita Ferrara, MD    DOA: 01/08/2020 LOS: 1   Brief Narrative   74 year old man PMH diabetes mellitus type 2 presented to the emergency department with polyuria, polydipsia and multiple falls at home.  Work-up revealed hyperglycemia 660 with normal anion gap and possible UTI.  Referred for admission.  Patient takes only oral medications for diabetes, had increased polyuria and polydipsia for some time.  Brought to the ED due to unsteady gait and multiple falls at home.       Assessment & Plan   Principal Problem:   Hyperglycemia Active Problems:   Thrombocytopenia (HCC)   Pyuria   Type 2 diabetes mellitus with hyperglycemia (HCC)  Hyperglycemia due to hyperosmolar hyperglycemic state: present on admission Type 2 diabetes mellitus -uncontrolled, hemoglobin A1c 12.9%.  Home regimen appears to be glipizide 10 mg, Metformin 1000 mg twice daily.  Patient and wife are not comfortable using insulin at home due to safety.  They had agreed to try insulin, however during education and training this admission, it has become clear insulin would be unsafe for home use at this time. Patient is unable to follow repeated verbal instructions and assistance from his nurse while giving insulin. Continues to have sugars in 200-300's.  Will be very difficult to adequately control without insulin but it seems patient would require placement at SNF or assisted living to safely use insulin in the outpatient setting. --Diabetes coordinator following --Will transition to completely oral regimen --Metformin 1000 g twice daily --Increase glipizide to 10 mg twice daily (from once daily) --Add Tradjenta 5 mg daily --DM coordinator reccomends Prandin 1 mg TID WC - not on formulary here  Abnormal urinalysis/pyuria: Urine culture from urinalysis on 01/08/2020 showed insignificant growth.  Purulent looking no milky discharge from penis, but  negative for gonorrhea and chlamydia by PCR.  Stop antibiotics.  Falls at home: PT recommended home health at discharge.    Hyponatremia: Resolved.  Thrombocytopenia: Repeat CBC .  Patient BMI: Body mass index is 28.68 kg/m.   DVT prophylaxis: SCD's  Diet:  Diet Orders (From admission, onward)    Start     Ordered   01/08/20 1345  Diet Carb Modified Fluid consistency: Thin; Room service appropriate? Yes  (Hyperglycemic Hyperosmolar State (HHS))  Diet effective now    Question Answer Comment  Diet-HS Snack? Nothing   Calorie Level Medium 1600-2000   Fluid consistency: Thin   Room service appropriate? Yes      01/08/20 1344            Code Status: Full Code    Subjective 01/11/20    Patient seen at bedside.  Wife not present.  Patient reports he feels good.  Asks if he can go home today.  Denies fevers or chills, chest pain or shortness of breath, nausea vomiting or other complaints.  Spoke with patient's nurse, who reported concerns with safety of patient using insulin at home.  She reported he is unable to follow her repeated instructions at bedside during teaching and education, while attempting to give himself insulin.  Multiple times he almost stuck himself with the needle, he does not watch while he is injecting himself and pulls the needle out before all the insulin is injected.     Disposition Plan & Communication   Status is: Inpatient  Remains inpatient appropriate because:his diabetes regimen is being adjusted and he continues to be hyperglycemic.  Patient presented is HHS, has Hbg A1c 12.9% but seems to be unsafe to self-administer insulin and wife also uncomfortable with it.     Dispo: The patient is from: Home              Anticipated d/c is to: Home              Anticipated d/c date is: 1 day              Patient currently is not medically stable to d/c.    Family Communication: none at bedside, will attempt to call    Consults, Procedures,  Significant Events   Consultants:   None  Procedures:   None  Antimicrobials:   Rocephin    Objective   Vitals:   01/10/20 0735 01/10/20 1634 01/10/20 2328 01/11/20 0819  BP: 116/68 104/76 134/84 125/76  Pulse: 87 84 83 89  Resp: 16 16 17 17   Temp: 98.9 F (37.2 C) 98.7 F (37.1 C) 98.2 F (36.8 C) 98.5 F (36.9 C)  TempSrc: Oral  Oral   SpO2: 94% 93% 95% 94%  Weight:      Height:        Intake/Output Summary (Last 24 hours) at 01/11/2020 1444 Last data filed at 01/11/2020 1413 Gross per 24 hour  Intake 360 ml  Output 1200 ml  Net -840 ml   Filed Weights   01/08/20 0942 01/08/20 1334 01/09/20 0500  Weight: 83.9 kg 66.4 kg 64.4 kg    Physical Exam:  General exam: awake, alert, no acute distress, disheveled appearing HEENT: moist mucus membranes, hearing grossly normal  Respiratory system: CTAB, no wheezes, rales or rhonchi, normal respiratory effort. Cardiovascular system: normal S1/S2, RRR, no JVD, murmurs, rubs, gallops, no pedal edema.   Gastrointestinal system: soft, NT, ND, no HSM felt, +bowel sounds. Central nervous system: A&O x3. no gross focal neurologic deficits, normal speech Extremities: moves all, no edema, normal tone Psychiatry: normal mood, congruent affect  Labs   Data Reviewed: I have personally reviewed following labs and imaging studies  CBC: Recent Labs  Lab 01/08/20 1000  WBC 6.8  NEUTROABS 4.7  HGB 13.4  HCT 37.1*  MCV 87.7  PLT 734*   Basic Metabolic Panel: Recent Labs  Lab 01/08/20 1403 01/08/20 1403 01/08/20 1659 01/08/20 2228 01/09/20 1245 01/10/20 0605 01/11/20 0531  NA 129*  --  133* 134*  --  135 135  K 3.9  --  3.7 3.5  --  3.7 3.6  CL 93*  --  99 102  --  107 104  CO2 28  --  26 26  --  21* 25  GLUCOSE 538*   < > 327* 162* 385* 271* 203*  BUN 11  --  9 11  --  12 13  CREATININE 0.81  --  0.76 0.68  --  0.63 0.64  CALCIUM 8.2*  --  8.3* 8.1*  --  8.1* 8.5*   < > = values in this interval not displayed.    GFR: Estimated Creatinine Clearance: 63.3 mL/min (by C-G formula based on SCr of 0.64 mg/dL). Liver Function Tests: Recent Labs  Lab 01/08/20 1000  AST 23  ALT 18  ALKPHOS 109  BILITOT 1.5*  PROT 7.6  ALBUMIN 3.2*   No results for input(s): LIPASE, AMYLASE in the last 168 hours. No results for input(s): AMMONIA in the last 168 hours. Coagulation Profile: Recent Labs  Lab 01/08/20 1000  INR 1.1   Cardiac Enzymes:  No results for input(s): CKTOTAL, CKMB, CKMBINDEX, TROPONINI in the last 168 hours. BNP (last 3 results) No results for input(s): PROBNP in the last 8760 hours. HbA1C: No results for input(s): HGBA1C in the last 72 hours. CBG: Recent Labs  Lab 01/10/20 1130 01/10/20 1631 01/10/20 2053 01/11/20 0819 01/11/20 1213  GLUCAP 361* 235* 179* 241* 266*   Lipid Profile: No results for input(s): CHOL, HDL, LDLCALC, TRIG, CHOLHDL, LDLDIRECT in the last 72 hours. Thyroid Function Tests: No results for input(s): TSH, T4TOTAL, FREET4, T3FREE, THYROIDAB in the last 72 hours. Anemia Panel: No results for input(s): VITAMINB12, FOLATE, FERRITIN, TIBC, IRON, RETICCTPCT in the last 72 hours. Sepsis Labs: No results for input(s): PROCALCITON, LATICACIDVEN in the last 168 hours.  Recent Results (from the past 240 hour(s))  Urine culture     Status: Abnormal   Collection Time: 01/08/20  9:54 AM   Specimen: Urine, Random  Result Value Ref Range Status   Specimen Description   Final    URINE, RANDOM Performed at [facility], [address] [place], [place] [address]    Special Requests   Final    NONE Performed at [facility], [address] [place], [place] [address]    Culture (A)  Final    <10,000 COLONIES/mL INSIGNIFICANT GROWTH Performed at [facility], [address] [place], [place] [address]    Report Status 01/09/2020 FINAL  Final  Respiratory Panel by RT PCR (Flu A&B, Covid) - Nasopharyngeal Swab     Status: None    Collection Time: 01/08/20 12:24 PM   Specimen: Nasopharyngeal Swab  Result Value Ref Range Status   SARS Coronavirus 2 by RT PCR NEGATIVE NEGATIVE Final    Comment: (NOTE) SARS-CoV-2 target nucleic acids are NOT DETECTED. The SARS-CoV-2 RNA is generally detectable in upper respiratoy specimens during the acute phase of infection. The lowest concentration of SARS-CoV-2 viral copies this assay can detect is 131 copies/mL. A negative result does not preclude SARS-Cov-2 infection and should not be used as the sole basis for treatment or other patient management decisions. A negative result may occur with  improper specimen collection/handling, submission of specimen other than nasopharyngeal swab, presence of viral mutation(s) within the areas targeted by this assay, and inadequate number of viral copies (<131 copies/mL). A negative result must be combined with clinical observations, patient history, and epidemiological information. The expected result is Negative. Fact Sheet for Patients:  https://www.moore.com/ Fact Sheet for Healthcare Providers:  https://www.young.biz/ This test is not yet ap proved or cleared by the [place] FDA and  has been authorized for detection and/or diagnosis of SARS-CoV-2 by FDA under an Emergency Use Authorization (EUA). This EUA will remain  in effect (meaning this test can be used) for the duration of the COVID-19 declaration under Section 564(b)(1) of the Act, 21 U.S.C. section 360bbb-3(b)(1), unless the authorization is terminated or revoked sooner.    Influenza A by PCR NEGATIVE NEGATIVE Final   Influenza B by PCR NEGATIVE NEGATIVE Final    Comment: (NOTE) The Xpert Xpress SARS-CoV-2/FLU/RSV assay is intended as an aid in  the diagnosis of influenza from Nasopharyngeal swab specimens and  should not be used as a sole basis for treatment. Nasal washings and  aspirates are unacceptable for Xpert Xpress  SARS-CoV-2/FLU/RSV  testing. Fact Sheet for Patients: https://www.moore.com/ Fact Sheet for Healthcare Providers: https://www.young.biz/ This test is not yet approved or cleared by the [place] FDA and  has been authorized for detection  and/or diagnosis of SARS-CoV-2 by  FDA under an Emergency Use Authorization (EUA). This EUA will remain  in effect (meaning this test can be used) for the duration of the  Covid-19 declaration under Section 564(b)(1) of the Act, 21  U.S.C. section 360bbb-3(b)(1), unless the authorization is  terminated or revoked. Performed at [facility], [address] [place], [place] [address]   MRSA PCR Screening     Status: None   Collection Time: 01/08/20  1:44 PM   Specimen: Nasopharyngeal  Result Value Ref Range Status   MRSA by PCR NEGATIVE NEGATIVE Final    Comment:        The GeneXpert MRSA Assay (FDA approved for NASAL specimens only), is one component of a comprehensive MRSA colonization surveillance program. It is not intended to diagnose MRSA infection nor to guide or monitor treatment for MRSA infections. Performed at [facility], [address] [place], [place] [address]   Chlamydia/NGC rt PCR [facility] only)     Status: None   Collection Time: 01/10/20  2:00 PM   Specimen: Urine  Result Value Ref Range Status   Specimen source GC/Chlam URINE, RANDOM  Final   Chlamydia Tr NOT DETECTED NOT DETECTED Final   N gonorrhoeae NOT DETECTED NOT DETECTED Final    Comment: (NOTE) This CT/NG assay has not been evaluated in patients with a history of  hysterectomy. Performed at [facility], [address], [place], [place] [address]       Imaging Studies   No results found.   Medications   Scheduled Meds: . aspirin EC  81 mg Oral Daily  . Chlorhexidine Gluconate Cloth  6 each Topical Daily  . colchicine  0.6 mg Oral Daily  . glipiZIDE  10 mg Oral QAC breakfast  .  insulin aspart  0-15 Units Subcutaneous TID WC  . insulin aspart  0-5 Units Subcutaneous QHS  . insulin glargine  45 Units Subcutaneous Daily  . metFORMIN  1,000 mg Oral BID WC  . simvastatin  40 mg Oral Daily  . sodium chloride flush  3 mL Intravenous Q12H  . tamsulosin  0.4 mg Oral QPC supper   Continuous Infusions: . cefTRIAXone (ROCEPHIN)  IV 1 g (01/11/20 0935)       LOS: 1 day    Time spent: 45 minutes total time with >50% spent coordinating care and face to face with patient.    Pennie Banter, DO [facility]  01/11/2020, 2:44 PM    If 7PM-7AM, please contact night-coverage. How to contact the [facility] Attending or Consulting provider 7A - 7P or covering provider during after hours 7P -7A, for this patient?    1. Check the care team in [facility] and look for a) attending/consulting TRH provider listed and b) the [facility] team listed 2. Log into [email or web address] and use [place]'s universal password to access. If you do not have the password, please contact the hospital operator. 3. Locate the [facility] provider you are looking for under [facility] and page to a number that you can be directly reached. 4. If you still have difficulty reaching the provider, please page the [facility] (Director on Call) for the Hospitalists listed on amion for assistance.

## 2020-01-11 NOTE — TOC Progression Note (Signed)
Transition of Care [facility]) - Progression Note    Patient Details  Name: ABDULRAHEEM PINEO MRN: [id number] Date of Birth: [birth year]  [facility]) CM/SW Contact  Allayne Butcher, RN Phone Number: 01/11/2020, 10:09 AM  Clinical Narrative:    Patient reports that he feels good and hopes to be discharged today.  Patient agrees to home health services and choose [facility].  Christy Gentles, with [facility] has accepted the referral for RN, PT, OT and SW.  Patient also needs a rollling walker and 3 in 1 at discharge.  [employer] will provide equipment and deliver to the room before discharge.    Expected Discharge Plan: Home w Home Health Services Barriers to Discharge: Continued Medical Work up  Expected Discharge Plan and Services Expected Discharge Plan: Home w Home Health Services   Discharge Planning Services: CM Consult Post Acute Care Choice: Home Health Living arrangements for the past 2 months: Single Family Home                 DME Arranged: 3-N-1, Walker rolling DME Agency: [employer] Date DME Agency Contacted: 01/11/20 Time DME Agency Contacted: [phone] Representative spoke with at DME Agency: Olegario Messier HH Arranged: RN, PT, OT, Social Work HH Agency: [facility] Date HH Agency Contacted: 01/11/20 Time HH Agency Contacted: [phone] Representative spoke with at [facility] Agency: Christy Gentles   Social Determinants of Health (SDOH) Interventions    Readmission Risk Interventions No flowsheet data found.

## 2020-01-11 NOTE — Progress Notes (Signed)
Physical Therapy Treatment Patient Details Name: Gregory Miller MRN: [id number] DOB: 11/14/1945 Today's Date: 01/11/2020    History of Present Illness Gregory Miller is a 38yoM who comes to [facility] after several days slurred speech, instability of gait, family reports he appears drunk. Pt noted to have elevated BG 600s upon entry.    PT Comments    Pt resting in bed upon PT arrival; pt's bed linens noted to be wet so linens changed.  SBA with bed mobility and CGA to SBA with transfers; CGA ambulating 200 feet with RW.  Pt noted with wider BOS, B LE toe out, and ataxic movement noted.  No loss of balance noted during sessions activities.  Pt declined further activities d/t wanting to rest.  Will continue to focus on strengthening, balance, and progressive functional mobility.   Follow Up Recommendations  Home health PT;Supervision for mobility/OOB     Equipment Recommendations  Rolling walker with 5" wheels    Recommendations for Other Services       Precautions / Restrictions Precautions Precautions: Fall Restrictions Weight Bearing Restrictions: No    Mobility  Bed Mobility Overal bed mobility: Needs Assistance Bed Mobility: Supine to Sit;Sit to Supine     Supine to sit: Supervision Sit to supine: Supervision   General bed mobility comments: SBA for safety but no assist required  Transfers Overall transfer level: Needs assistance Equipment used: Rolling walker (2 wheeled) Transfers: Sit to/from Stand Sit to Stand: Min guard;Supervision         General transfer comment: x2 trials standing from bed; fairly strong stand and controlled descent noted  Ambulation/Gait Ambulation/Gait assistance: Min guard Gait Distance (Feet): 200 Feet Assistive device: Rolling walker (2 wheeled) Gait Pattern/deviations: Ataxic;Wide base of support     General Gait Details: no loss of balance noted with ambulation   Stairs             Wheelchair Mobility    Modified Rankin  (Stroke Patients Only)       Balance Overall balance assessment: Needs assistance;History of Falls Sitting-balance support: No upper extremity supported Sitting balance-Leahy Scale: Good Sitting balance - Comments: steady sitting reaching outside BOS   Standing balance support: No upper extremity supported Standing balance-Leahy Scale: Good Standing balance comment: steady standing leaning forward arranging sheets on bed and partway reaching down to grab phone cord                            Cognition Arousal/Alertness: Awake/alert Behavior During Therapy: WFL for tasks assessed/performed Overall Cognitive Status: Difficult to assess Area of Impairment: Safety/judgement;Following commands;Problem solving                       Following Commands: Follows one step commands inconsistently Safety/Judgement: Decreased awareness of safety;Decreased awareness of deficits   Problem Solving: Slow processing;Difficulty sequencing;Requires verbal cues;Requires tactile cues General Comments: Oriented to person, place, time and situation      Exercises      General Comments   Nursing cleared pt for participation in physical therapy.  Pt agreeable to PT session.      Pertinent Vitals/Pain Pain Assessment: No/denies pain  Vitals (HR and O2 on room air) stable and WFL throughout treatment session.    Home Living                      Prior Function  PT Goals (current goals can now be found in the care plan section) Acute Rehab PT Goals Patient Stated Goal: be safe with moblity at home PT Goal Formulation: With patient Time For Goal Achievement: 01/24/20 Potential to Achieve Goals: Good Progress towards PT goals: Progressing toward goals    Frequency    7X/week      PT Plan Current plan remains appropriate    Co-evaluation              AM-PAC PT "6 Clicks" Mobility   Outcome Measure  Help needed turning from your back to  your side while in a flat bed without using bedrails?: A Little Help needed moving from lying on your back to sitting on the side of a flat bed without using bedrails?: A Little Help needed moving to and from a bed to a chair (including a wheelchair)?: A Little Help needed standing up from a chair using your arms (e.g., wheelchair or bedside chair)?: A Little Help needed to walk in hospital room?: A Little Help needed climbing 3-5 steps with a railing? : A Little 6 Click Score: 18    End of Session Equipment Utilized During Treatment: Gait belt Activity Tolerance: Patient tolerated treatment well Patient left: in bed;with call bell/phone within reach;with bed alarm set Nurse Communication: Mobility status;Precautions PT Visit Diagnosis: Ataxic gait (R26.0);Other abnormalities of gait and mobility (R26.89)     Time: 1115-1130 PT Time Calculation (min) (ACUTE ONLY): 15 min  Charges:  $Gait Training: 8-22 mins                     Hendricks Limes, PT 01/11/20, 12:54 PM

## 2020-01-12 LAB — BASIC METABOLIC PANEL
Anion gap: 7 (ref 5–15)
BUN: 14 mg/dL (ref 8–23)
CO2: 25 mmol/L (ref 22–32)
Calcium: 8.7 mg/dL — ABNORMAL LOW (ref 8.9–10.3)
Chloride: 105 mmol/L (ref 98–111)
Creatinine, Ser: 0.63 mg/dL (ref 0.61–1.24)
GFR calc Af Amer: >60 mL/min (ref >60)
GFR calc non Af Amer: >60 mL/min (ref >60)
Glucose, Bld: 85 mg/dL (ref 70–99)
Potassium: 3.6 mmol/L (ref 3.5–5.1)
Sodium: 137 mmol/L (ref 135–145)

## 2020-01-12 LAB — GLUCOSE, CAPILLARY
Glucose-Capillary: 116 mg/dL — ABNORMAL HIGH (ref 70–99)
Glucose-Capillary: 208 mg/dL — ABNORMAL HIGH (ref 70–99)

## 2020-01-12 MED ORDER — LINAGLIPTIN 5 MG PO TABS: 5.0000 mg | ORAL_TABLET | Freq: Every day | ORAL | 1 refills | Status: AC

## 2020-01-12 MED ORDER — FLUCONAZOLE 50 MG PO TABS: 150.0000 mg | ORAL_TABLET | Freq: Once | ORAL | Status: DC

## 2020-01-12 MED ORDER — TAMSULOSIN HCL 0.4 MG PO CAPS: 0.4000 mg | ORAL_CAPSULE | Freq: Every day | ORAL | Status: AC

## 2020-01-12 MED ORDER — GLIPIZIDE 10 MG PO TABS
10.0000 mg | ORAL_TABLET | Freq: Every day | ORAL | Status: DC
  Filled 2020-01-12: qty 1

## 2020-01-12 NOTE — Progress Notes (Signed)
Physical Therapy Treatment Patient Details Name: Gregory Miller MRN: [id number] DOB: 11/13/1945 Today's Date: 01/12/2020    History of Present Illness Gregory Miller is a 42yoM who comes to [facility] after several days slurred speech, instability of gait, family reports he appears drunk. Pt noted to have elevated BG 600s upon entry.    PT Comments    Pt able to get up and complete 1 lap around unit with RW and min guard/supervision.  Gait remains generally unsteady but no LOB's.  He is encouraged to have +1 assist upon discharge home for mobility.   Follow Up Recommendations  Home health PT;Supervision for mobility/OOB     Equipment Recommendations  Rolling walker with 5" wheels    Recommendations for Other Services       Precautions / Restrictions Precautions Precautions: Fall Restrictions Weight Bearing Restrictions: No    Mobility  Bed Mobility Overal bed mobility: Needs Assistance Bed Mobility: Supine to Sit     Supine to sit: Supervision     General bed mobility comments: SBA for safety but no assist required  Transfers Overall transfer level: Needs assistance Equipment used: Rolling walker (2 wheeled) Transfers: Sit to/from Stand Sit to Stand: Min guard;Supervision            Ambulation/Gait Ambulation/Gait assistance: Min guard Gait Distance (Feet): 200 Feet Assistive device: Rolling walker (2 wheeled) Gait Pattern/deviations: Ataxic;Wide base of support     General Gait Details: no loss of balance noted with ambulation   Stairs             Wheelchair Mobility    Modified Rankin (Stroke Patients Only)       Balance Overall balance assessment: Needs assistance;History of Falls Sitting-balance support: No upper extremity supported Sitting balance-Leahy Scale: Good Sitting balance - Comments: steady sitting reaching outside BOS   Standing balance support: No upper extremity supported Standing balance-Leahy Scale: Good Standing balance  comment: able to walk from door to bed without AD.  Pt elects to leave it to the side.  Increased safety with walker and encouraged to use it especially in open area's where light touch assist is unavailable.                            Cognition Arousal/Alertness: Awake/alert Behavior During Therapy: WFL for tasks assessed/performed Overall Cognitive Status: Difficult to assess Area of Impairment: Safety/judgement;Following commands;Problem solving                                      Exercises      General Comments        Pertinent Vitals/Pain Pain Assessment: No/denies pain    Home Living                      Prior Function            PT Goals (current goals can now be found in the care plan section) Progress towards PT goals: Progressing toward goals    Frequency           PT Plan Current plan remains appropriate    Co-evaluation              AM-PAC PT "6 Clicks" Mobility   Outcome Measure  Help needed turning from your back to your side while in a flat bed without using bedrails?: None Help  needed moving from lying on your back to sitting on the side of a flat bed without using bedrails?: None Help needed moving to and from a bed to a chair (including a wheelchair)?: A Little Help needed standing up from a chair using your arms (e.g., wheelchair or bedside chair)?: A Little Help needed to walk in hospital room?: A Little Help needed climbing 3-5 steps with a railing? : A Little 6 Click Score: 20    End of Session Equipment Utilized During Treatment: Gait belt Activity Tolerance: Patient tolerated treatment well Patient left: in chair;with call bell/phone within reach;with chair alarm set Nurse Communication: Mobility status;Precautions       Time: 0034-9611 PT Time Calculation (min) (ACUTE ONLY): 9 min  Charges:  $Gait Training: 8-22 mins                    Danielle Dess, PTA 01/12/20, 9:44 AM

## 2020-01-12 NOTE — Discharge Summary (Signed)
Physician Discharge Summary  Gregory Miller KNL:[id number] DOB: 05/10/1946 DOA: 01/08/2020  PCP: Lucita Ferrara, MD  Admit date: 01/08/2020 Discharge date: 01/12/2020  Admitted From: home Disposition:  home  Recommendations for Outpatient Follow-up:  1. Follow up with PCP in 1-2 weeks 2. Please obtain BMP/CBC in one week 3. Please follow up on patient's blood pressure.  His Lasix and lisinopril were held during admission and blood pressures were normal and intermittently borderline low.  Both were stopped at time of discharge. 4. Please follow up on patient's diabetes regimen.  He presented in [facility], and was being treated with insulin during admission.  His A1C is 12.9%, very uncontrolled.  Patient and his wife are not comfortable using insulin, and patient's nurse had great concern about safety of insulin at home because patient was not able to follow her instructions in self-administering.  It seems he may not have been taking his glipizide and metformin, however, because his glucose is now fairly well-controlled with resumption of those medications and the addition of Tradjenta.    Home Health: PT, OT, RN, SW  Equipment/Devices: 3-n-1, rolling walker   Discharge Condition: Stable  CODE STATUS: Full  Diet recommendation: Heart Healthy / Carb Modified    Discharge Diagnoses: Principal Problem:   Hyperglycemia Active Problems:   Thrombocytopenia (HCC)   Pyuria   Type 2 diabetes mellitus with hyperglycemia (HCC)    Summary of HPI and Hospital Course:  74 year old man PMH diabetes mellitus type 2 presented to the emergency department with polyuria, polydipsia and multiple falls at home.  Work-up revealed hyperglycemia 660 with normal anion gap and possible UTI.  Referred for admission.  Patient takes only oral medications for diabetes, had increased polyuria and polydipsia for some time.  Brought to the ED due to unsteady gait and multiple falls at home.       Hyperglycemia due to  hyperosmolar hyperglycemic state: present on admission Type 2 diabetes mellitus -uncontrolled, hemoglobin A1c 12.9%.  Home regimen appears to be glipizide 10 mg, Metformin 1000 mg twice daily.  Patient and wife are not comfortable using insulin at home due to safety.  They had agreed to try insulin, patient was unable to follow repeated verbal instructions and assistance from his nurse while giving insulin.  Resumed his metformin, glipizide and added Tradjenta, and glucose was ranging 116-241 prior to discharge.  Abnormal urinalysis/pyuria: Urine culturefrom urinalysis on 5/2/2021showed insignificant growth. Purulent looking no milky discharge from penis, but negative for gonorrhea and chlamydia by PCR.  Stopped antibiotics. Patient asymptomatic.  Falls at home:PT recommended home health at discharge.  Hyponatremia:Resolved.  Repeat BMP in follow up.  Thrombocytopenia: Repeat CBC in followup.   Discharge Instructions   Discharge Instructions    Call MD for:   Complete by: As directed    Very high or very low blood sugars, or if having very severe thirst or very frequent urination.   Call MD for:  extreme fatigue   Complete by: As directed    Call MD for:  persistant dizziness or light-headedness   Complete by: As directed    Call MD for:  persistant nausea and vomiting   Complete by: As directed    Call MD for:  temperature >100.4   Complete by: As directed    Diet - low sodium heart healthy   Complete by: As directed    Discharge instructions   Complete by: As directed    Make sure you take all your medications exactly as prescribed, every  day. If you start having frequent severe thirst or urinating very frequently, that is a sign your blood sugar is very high. Please follow up with your primary care doctor next week. You have one new diabetes medicine - its called linagliptin (brand name Tradjenta), take it once daily, and continue taking your glipizide and  metformin.  We had to stop your Lasix (furosemide) and lisinopril because your blood pressure has been normal or low without those medicines.  Your primary care doctor will need to follow up on your blood pressure to see if medicine needs to be re-started.   Increase activity slowly   Complete by: As directed      Allergies as of 01/12/2020   No Known Allergies     Medication List    STOP taking these medications   furosemide 20 MG tablet Commonly known as: LASIX   lisinopril 2.5 MG tablet Commonly known as: ZESTRIL     TAKE these medications   aspirin 81 MG EC tablet Take by mouth.   Cholecalciferol 50 MCG (2000 UT) Tabs Take 2,000 Units by mouth daily.   colchicine 0.6 MG tablet Take 0.6 mg by mouth daily.   diazepam 2 MG tablet Commonly known as: Valium Take 1 tablet (2 mg total) by mouth every 8 (eight) hours as needed for muscle spasms.   glipiZIDE 5 MG tablet Commonly known as: GLUCOTROL Take 10 mg by mouth daily before breakfast.   linagliptin 5 MG Tabs tablet Commonly known as: TRADJENTA Take 1 tablet (5 mg total) by mouth daily. Start taking on: Jan 13, 2020   metFORMIN 1000 MG tablet Commonly known as: GLUCOPHAGE Take 1,000 mg by mouth 2 (two) times daily with a meal.   simvastatin 40 MG tablet Commonly known as: ZOCOR Take 40 mg by mouth daily.   tamsulosin 0.4 MG Caps capsule Commonly known as: FLOMAX Take 1 capsule (0.4 mg total) by mouth daily after supper. What changed: when to take this            Durable Medical Equipment  (From admission, onward)         Start     Ordered   01/11/20 1012  For home use only DME Walker rolling  Once    Question Answer Comment  Walker: With 5 Inch Wheels   Patient needs a walker to treat with the following condition Weakness      01/11/20 1012   01/11/20 1012  For home use only DME 3 n 1  Once     01/11/20 1012          No Known Allergies     Procedures/Studies: CT HEAD WO  CONTRAST  Result Date: 01/08/2020 CLINICAL DATA:  Multiple falls and slurred speech 2-3 days. Possible stroke. EXAM: CT HEAD WITHOUT CONTRAST TECHNIQUE: Contiguous axial images were obtained from the base of the skull through the vertex without intravenous contrast. COMPARISON:  None. FINDINGS: Brain: Ventricles, cisterns and other CSF spaces are within normal. There is no mass, mass effect, shift of midline structures or acute hemorrhage. No evidence to suggest acute infarction. There is mild chronic ischemic microvascular disease and age related atrophic change. Vascular: No hyperdense vessel or unexpected calcification. Skull: Normal. Negative for fracture or focal lesion. Sinuses/Orbits: No acute finding. Other: None. IMPRESSION: 1.  No acute findings. 2. Mild chronic ischemic microvascular disease and mild age related atrophic change. Electronically Signed   By: Elberta Fortisaniel  Boyle M.D.   On: 01/08/2020 10:51  Subjective: Patient says he feels good.  No fevers or chills, dizziness of lightheadedness.  No acute events or complaints.   Discharge Exam: Vitals:   01/12/20 0025 01/12/20 0940  BP: 105/61 (!) 122/59  Pulse: 80 (!) 102  Resp: 18   Temp: 97.6 F (36.4 C) 98 F (36.7 C)  SpO2: 96%    Vitals:   01/11/20 1700 01/11/20 1955 01/12/20 0025 01/12/20 0940  BP: 132/80 135/78 105/61 (!) 122/59  Pulse: 88 93 80 (!) 102  Resp: Temp: 98.3 F (36.8 C) 98.3 F (36.8 C) 97.6 F (36.4 C) 98 F (36.7 C)  TempSrc: Oral Oral Oral Oral  SpO2: 98% 94% 96%   Weight:      Height:        General: Pt is alert, awake, not in acute distress Cardiovascular: RRR, S1/S2 +, no rubs, no gallops Respiratory: CTA bilaterally, no wheezing, no rhonchi Abdominal: Soft, NT, ND, bowel sounds + Extremities: no edema, no cyanosis    The results of significant diagnostics from this hospitalization (including imaging, microbiology, ancillary and laboratory) are listed below for reference.      Microbiology: Recent Results (from the past 240 hour(s))  Urine culture     Status: Abnormal   Collection Time: 01/08/20  9:54 AM   Specimen: Urine, Random  Result Value Ref Range Status   Specimen Description   Final    URINE, RANDOM Performed at [facility], [address], [place], [place] [address]    Special Requests   Final    NONE Performed at [facility], [address], [place], [place] [address]    Culture (A)  Final    <10,000 COLONIES/mL INSIGNIFICANT GROWTH Performed at [facility], [address] [place], [place] [address]    Report Status 01/09/2020 FINAL  Final  Respiratory Panel by RT PCR (Flu A&B, Covid) - Nasopharyngeal Swab     Status: None   Collection Time: 01/08/20 12:24 PM   Specimen: Nasopharyngeal Swab  Result Value Ref Range Status   SARS Coronavirus 2 by RT PCR NEGATIVE NEGATIVE Final    Comment: (NOTE) SARS-CoV-2 target nucleic acids are NOT DETECTED. The SARS-CoV-2 RNA is generally detectable in upper respiratoy specimens during the acute phase of infection. The lowest concentration of SARS-CoV-2 viral copies this assay can detect is 131 copies/mL. A negative result does not preclude SARS-Cov-2 infection and should not be used as the sole basis for treatment or other patient management decisions. A negative result may occur with  improper specimen collection/handling, submission of specimen other than nasopharyngeal swab, presence of viral mutation(s) within the areas targeted by this assay, and inadequate number of viral copies (<131 copies/mL). A negative result must be combined with clinical observations, patient history, and epidemiological information. The expected result is Negative. Fact Sheet for Patients:  https://www.moore.com/ Fact Sheet for Healthcare Providers:  https://www.young.biz/ This test is not yet ap proved or cleared by the [place] FDA and  has  been authorized for detection and/or diagnosis of SARS-CoV-2 by FDA under an Emergency Use Authorization (EUA). This EUA will remain  in effect (meaning this test can be used) for the duration of the COVID-19 declaration under Section 564(b)(1) of the Act, 21 U.S.C. section 360bbb-3(b)(1), unless the authorization is terminated or revoked sooner.    Influenza A by PCR NEGATIVE NEGATIVE Final   Influenza B by PCR NEGATIVE NEGATIVE Final    Comment: (NOTE) The Xpert Xpress  SARS-CoV-2/FLU/RSV assay is intended as an aid in  the diagnosis of influenza from Nasopharyngeal swab specimens and  should not be used as a sole basis for treatment. Nasal washings and  aspirates are unacceptable for Xpert Xpress SARS-CoV-2/FLU/RSV  testing. Fact Sheet for Patients: https://www.moore.com/ Fact Sheet for Healthcare Providers: https://www.young.biz/ This test is not yet approved or cleared by the [place] FDA and  has been authorized for detection and/or diagnosis of SARS-CoV-2 by  FDA under an Emergency Use Authorization (EUA). This EUA will remain  in effect (meaning this test can be used) for the duration of the  Covid-19 declaration under Section 564(b)(1) of the Act, 21  U.S.C. section 360bbb-3(b)(1), unless the authorization is  terminated or revoked. Performed at [facility], [address] [place], [place] [address]   MRSA PCR Screening     Status: None   Collection Time: 01/08/20  1:44 PM   Specimen: Nasopharyngeal  Result Value Ref Range Status   MRSA by PCR NEGATIVE NEGATIVE Final    Comment:        The GeneXpert MRSA Assay (FDA approved for NASAL specimens only), is one component of a comprehensive MRSA colonization surveillance program. It is not intended to diagnose MRSA infection nor to guide or monitor treatment for MRSA infections. Performed at [facility], [address] [place], [place] [address]    Chlamydia/NGC rt PCR [facility] only)     Status: None   Collection Time: 01/10/20  2:00 PM   Specimen: Urine  Result Value Ref Range Status   Specimen source GC/Chlam URINE, RANDOM  Final   Chlamydia Tr NOT DETECTED NOT DETECTED Final   N gonorrhoeae NOT DETECTED NOT DETECTED Final    Comment: (NOTE) This CT/NG assay has not been evaluated in patients with a history of  hysterectomy. Performed at [facility], [address] [place], [place] [address]   Urine Culture     Status: Abnormal   Collection Time: 01/10/20  2:00 PM   Specimen: Urine, Random  Result Value Ref Range Status   Specimen Description   Final    URINE, RANDOM Performed at [facility], [address], [place], [place] [address]    Special Requests   Final    NONE Performed at [facility], [address] [place], [place] [address]    Culture (A)  Final    <10,000 COLONIES/mL INSIGNIFICANT GROWTH Performed at [facility], [address] [place], [place] [address]    Report Status 01/11/2020 FINAL  Final     Labs: BNP (last 3 results) No results for input(s): BNP in the last 8760 hours. Basic Metabolic Panel: Recent Labs  Lab 01/08/20 1659 01/08/20 1659 01/08/20 2228 01/09/20 1245 01/10/20 0605 01/11/20 0531 01/12/20 0526  NA 133*  --  134*  --  135 135 137  K 3.7  --  3.5  --  3.7 3.6 3.6  CL 99  --  102  --  107 104 105  CO2 26  --  26  --  21* 25 25  GLUCOSE 327*   < > 162* 385* 271* 203* 85  BUN 9  --  11  --  12 13 14   CREATININE 0.76  --  0.68  --  0.63 0.64 0.63  CALCIUM 8.3*  --  8.1*  --  8.1* 8.5* 8.7*   < > = values in this interval not displayed.   Liver Function Tests: Recent Labs  Lab 01/08/20 1000  AST 23  ALT 18  ALKPHOS 109  BILITOT 1.5*  PROT 7.6  ALBUMIN 3.2*   No results for input(s): LIPASE, AMYLASE in the last 168 hours. No results for input(s): AMMONIA in the last 168 hours. CBC: Recent Labs  Lab 01/08/20 1000  WBC 6.8   NEUTROABS 4.7  HGB 13.4  HCT 37.1*  MCV 87.7  PLT 116*   Cardiac Enzymes: No results for input(s): CKTOTAL, CKMB, CKMBINDEX, TROPONINI in the last 168 hours. BNP: Invalid input(s): POCBNP CBG: Recent Labs  Lab 01/11/20 1213 01/11/20 1724 01/11/20 2133 01/12/20 0812 01/12/20 1130  GLUCAP 266* 218* 213* 116* 208*   D-Dimer No results for input(s): DDIMER in the last 72 hours. Hgb A1c No results for input(s): HGBA1C in the last 72 hours. Lipid Profile No results for input(s): CHOL, HDL, LDLCALC, TRIG, CHOLHDL, LDLDIRECT in the last 72 hours. Thyroid function studies No results for input(s): TSH, T4TOTAL, T3FREE, THYROIDAB in the last 72 hours.  Invalid input(s): FREET3 Anemia work up No results for input(s): VITAMINB12, FOLATE, FERRITIN, TIBC, IRON, RETICCTPCT in the last 72 hours. Urinalysis    Component Value Date/Time   COLORURINE YELLOW (A) 01/10/2020 1400   APPEARANCEUR CLOUDY (A) 01/10/2020 1400   APPEARANCEUR Clear 11/22/2011 1304   LABSPEC 1.011 01/10/2020 1400   LABSPEC 1.019 11/22/2011 1304   PHURINE 5.0 01/10/2020 1400   GLUCOSEU >=500 (A) 01/10/2020 1400   GLUCOSEU 50 mg/dL 12/45/8099 8338   HGBUR SMALL (A) 01/10/2020 1400   BILIRUBINUR NEGATIVE 01/10/2020 1400   BILIRUBINUR Negative 11/22/2011 1304   KETONESUR NEGATIVE 01/10/2020 1400   PROTEINUR NEGATIVE 01/10/2020 1400   NITRITE NEGATIVE 01/10/2020 1400   LEUKOCYTESUR LARGE (A) 01/10/2020 1400   LEUKOCYTESUR Negative 11/22/2011 1304   Sepsis Labs Invalid input(s): PROCALCITONIN,  WBC,  LACTICIDVEN Microbiology Recent Results (from the past 240 hour(s))  Urine culture     Status: Abnormal   Collection Time: 01/08/20  9:54 AM   Specimen: Urine, Random  Result Value Ref Range Status   Specimen Description   Final    URINE, RANDOM Performed at [facility], [address] [place], [place] [address]    Special Requests   Final    NONE Performed at [facility], [address], [place], [place] [address]    Culture (A)  Final    <10,000 COLONIES/mL INSIGNIFICANT GROWTH Performed at [facility], [address], [place], [place] [address]    Report Status 01/09/2020 FINAL  Final  Respiratory Panel by RT PCR (Flu A&B, Covid) - Nasopharyngeal Swab     Status: None   Collection Time: 01/08/20 12:24 PM   Specimen: Nasopharyngeal Swab  Result Value Ref Range Status   SARS Coronavirus 2 by RT PCR NEGATIVE NEGATIVE Final    Comment: (NOTE) SARS-CoV-2 target nucleic acids are NOT DETECTED. The SARS-CoV-2 RNA is generally detectable in upper respiratoy specimens during the acute phase of infection. The lowest concentration of SARS-CoV-2 viral copies this assay can detect is 131 copies/mL. A negative result does not preclude SARS-Cov-2 infection and should not be used as the sole basis for treatment or other patient management decisions. A negative result may occur with  improper specimen collection/handling, submission of specimen other than nasopharyngeal swab, presence of viral mutation(s) within the areas targeted by this assay, and inadequate number of viral copies (<131 copies/mL). A negative result must be combined with clinical observations, patient history, and epidemiological information. The expected result  is Negative. Fact Sheet for Patients:  PinkCheek.be Fact Sheet for Healthcare Providers:  GravelBags.it This test is not yet ap proved or cleared by the [place] FDA and  has been authorized for detection and/or diagnosis of SARS-CoV-2 by FDA under an Emergency Use Authorization (EUA). This EUA will remain  in effect (meaning this test can be used) for the duration of the COVID-19 declaration under Section 564(b)(1) of the Act, 21 U.S.C. section 360bbb-3(b)(1), unless the authorization is terminated or revoked sooner.    Influenza A by PCR NEGATIVE NEGATIVE Final    Influenza B by PCR NEGATIVE NEGATIVE Final    Comment: (NOTE) The Xpert Xpress SARS-CoV-2/FLU/RSV assay is intended as an aid in  the diagnosis of influenza from Nasopharyngeal swab specimens and  should not be used as a sole basis for treatment. Nasal washings and  aspirates are unacceptable for Xpert Xpress SARS-CoV-2/FLU/RSV  testing. Fact Sheet for Patients: PinkCheek.be Fact Sheet for Healthcare Providers: GravelBags.it This test is not yet approved or cleared by the [place] FDA and  has been authorized for detection and/or diagnosis of SARS-CoV-2 by  FDA under an Emergency Use Authorization (EUA). This EUA will remain  in effect (meaning this test can be used) for the duration of the  Covid-19 declaration under Section 564(b)(1) of the Act, 21  U.S.C. section 360bbb-3(b)(1), unless the authorization is  terminated or revoked. Performed at [facility], [address] [place], [place] [address]   MRSA PCR Screening     Status: None   Collection Time: 01/08/20  1:44 PM   Specimen: Nasopharyngeal  Result Value Ref Range Status   MRSA by PCR NEGATIVE NEGATIVE Final    Comment:        The GeneXpert MRSA Assay (FDA approved for NASAL specimens only), is one component of a comprehensive MRSA colonization surveillance program. It is not intended to diagnose MRSA infection nor to guide or monitor treatment for MRSA infections. Performed at [facility], [address] [place], [place] [address]   [place] rt PCR [facility] only)     Status: None   Collection Time: 01/10/20  2:00 PM   Specimen: Urine  Result Value Ref Range Status   Specimen source GC/Chlam URINE, RANDOM  Final   Chlamydia Tr NOT DETECTED NOT DETECTED Final   N gonorrhoeae NOT DETECTED NOT DETECTED Final    Comment: (NOTE) This CT/NG assay has not been evaluated in patients with a history of  hysterectomy. Performed at  [facility], [address], [place], [place] [address]   Urine Culture     Status: Abnormal   Collection Time: 01/10/20  2:00 PM   Specimen: Urine, Random  Result Value Ref Range Status   Specimen Description   Final    URINE, RANDOM Performed at [facility], [address], [place], [place] [address]    Special Requests   Final    NONE Performed at [facility], [address] [place], [place] [address]    Culture (A)  Final    <10,000 COLONIES/mL INSIGNIFICANT GROWTH Performed at [facility], [place] [address] [place], [place] [address]    Report Status 01/11/2020 FINAL  Final     Time coordinating discharge: Over 30 minutes  SIGNED:   Ezekiel Slocumb, DO [facility] 01/12/2020, 12:18 PM   If 7PM-7AM, please contact night-coverage [email or web address]

## 2020-01-12 NOTE — Progress Notes (Signed)
Inpatient Diabetes Program Recommendations  AACE/ADA: New Consensus Statement on Inpatient Glycemic Control   Target Ranges:  Prepandial:   less than 140 mg/dL      Peak postprandial:   less than 180 mg/dL (1-2 hours)      Critically ill patients:  140 - 180 mg/dL  Results for MERRIK, PUEBLA (MRN [id number]) as of 01/12/2020 07:24  Ref. Range 01/12/2020 05:26  Glucose Latest Ref Range: 70 - 99 mg/dL 85   Results for WESS, BANEY (MRN [id number]) as of 01/12/2020 07:24  Ref. Range 01/10/2020 08:33 01/10/2020 11:30 01/10/2020 16:31 01/10/2020 20:53 01/11/2020 08:19 01/11/2020 12:13 01/11/2020 17:24 01/11/2020 21:33  Glucose-Capillary Latest Ref Range: 70 - 99 mg/dL 703 (H) 500 (H) 938 (H) 179 (H) 241 (H) 266 (H) 218 (H) 213 (H)   Review of Glycemic Control  Diabetes history: DM2 Outpatient Diabetes medications: Glipizide 10 mg QAM, Metformin 1000 mg BID Current orders for Inpatient glycemic control: Glipizide 10 mg BID, Tradjenta 5 mg daily, Metformin 1000 mg BID, Novolog 0-15 units TID with meals, Novolog 0-5 units QHS  Inpatient Diabetes Program Recommendations:   Oral DM medications: Patient received Glipizide 10 mg once on 01/11/20 and fasting lab glucose is 85 mg/dl today. Would recommend decreasing Glipizide to 10 mg daily.  NOTE: Lantus was discontinued on 01/11/20 and patient did not receive any Lantus on 01/11/20 since he was restarted on oral DM medications on 01/11/20. Lab glucose 85 mg/dl today.   Thanks, Orlando Penner, RN, MSN, CDE Diabetes Coordinator Inpatient Diabetes Program [phone] (Team Pager from 8am to 5pm)

## 2021-02-26 IMAGING — CT CT HEAD W/O CM
3 series · 15 of 46 positions shown, 18 images · non-contrast
Comparison: None.

CLINICAL DATA: Multiple falls and slurred speech 2-3 days. Possible
stroke.

EXAM:
CT HEAD WITHOUT CONTRAST
TECHNIQUE: Contiguous axial images were obtained from the base of the skull
through the vertex without intravenous contrast.

[Series 3: head wo · axial · 0.40mm/px · z∈[-101,+19]mm · 9 of 29 slices shown, 12 images]
[im 3/29  brain]
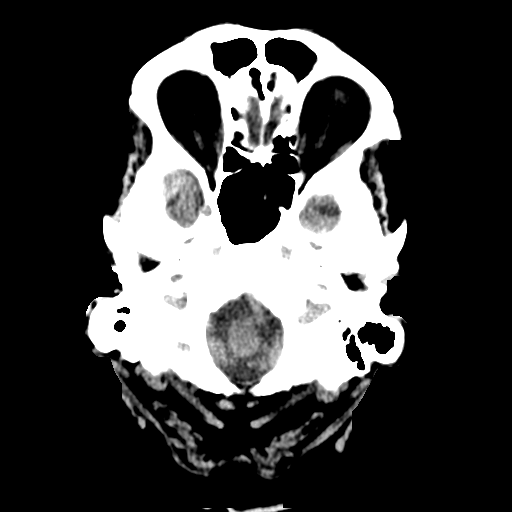
[im 3/29  bone]
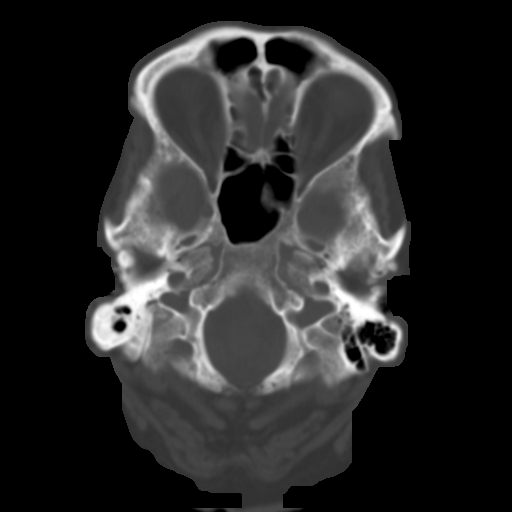
[im 6/29  brain]
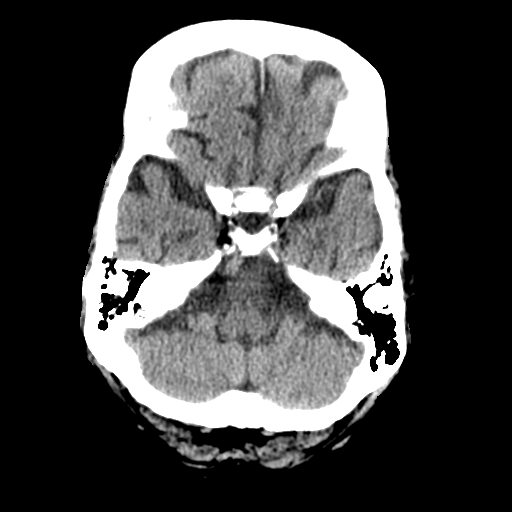
[im 9/29  brain]
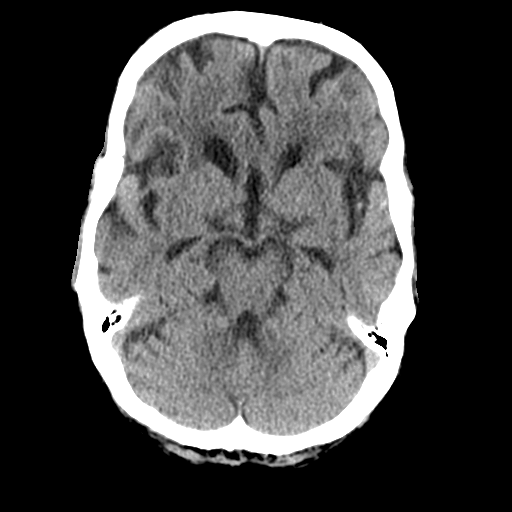
[im 12/29  brain]
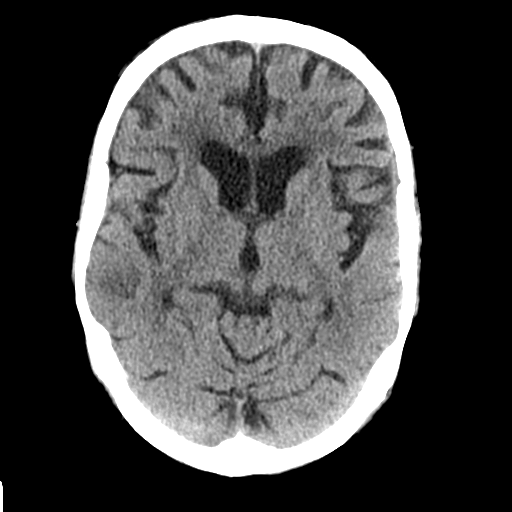
[im 15/29  brain]
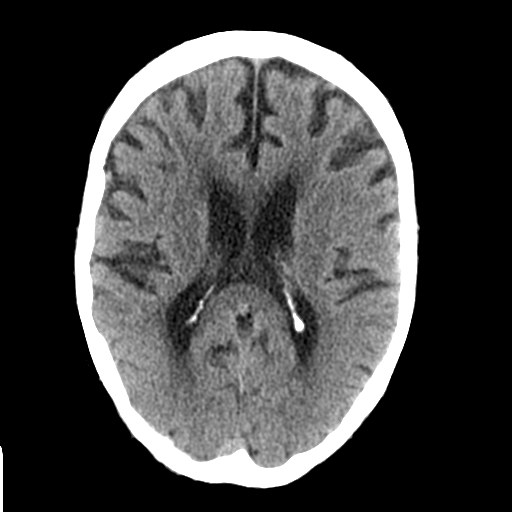
[im 15/29  bone]
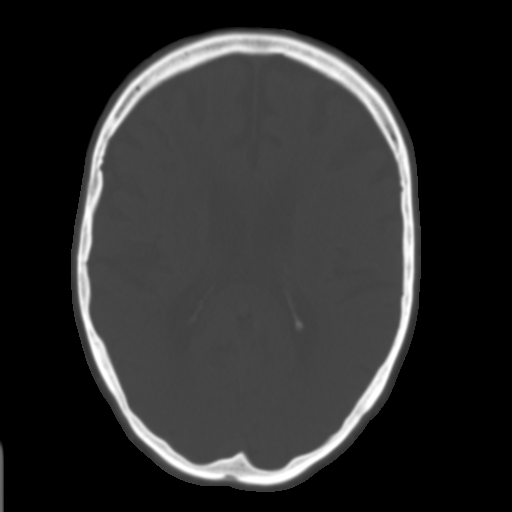
[im 18/29  brain]
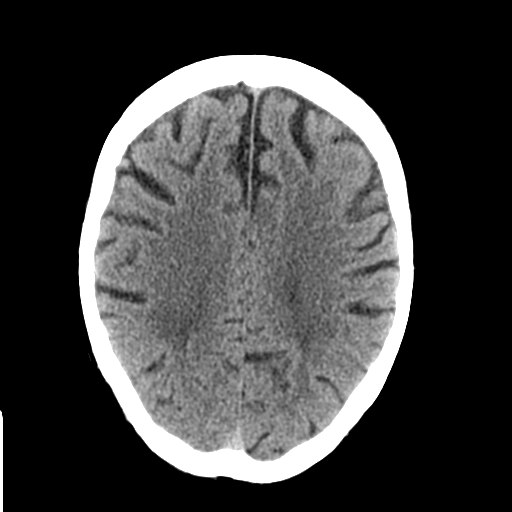
[im 21/29  brain]
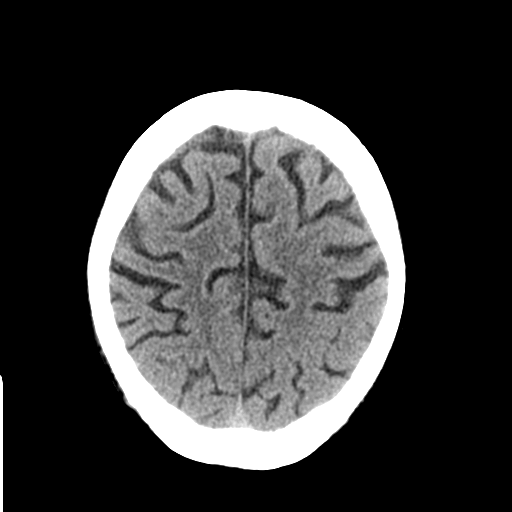
[im 24/29  brain]
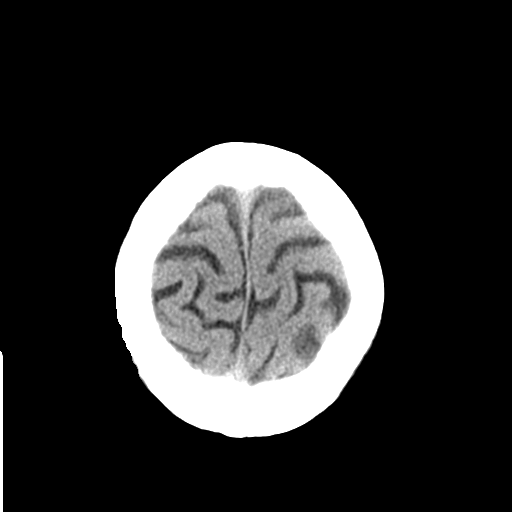
[im 27/29  brain]
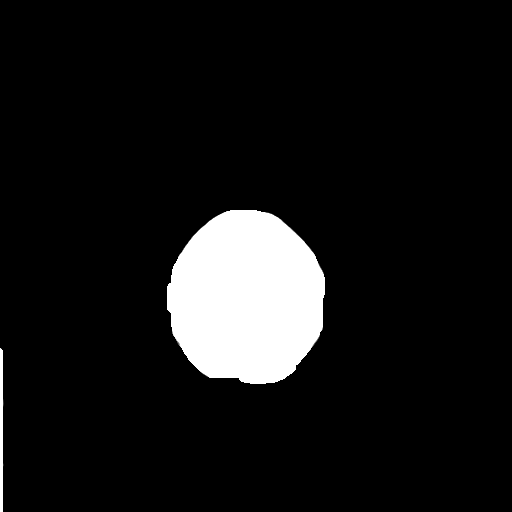
[im 27/29  bone]
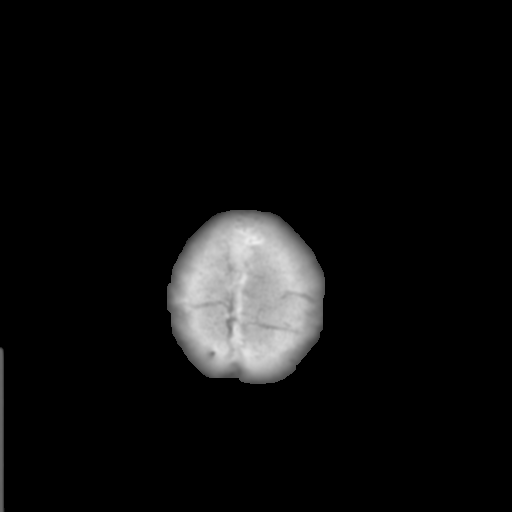

[Series 4: coronal soft tissue · coronal · 0.29mm/px · 3 of 65 slices shown]
[im 22/65  brain]
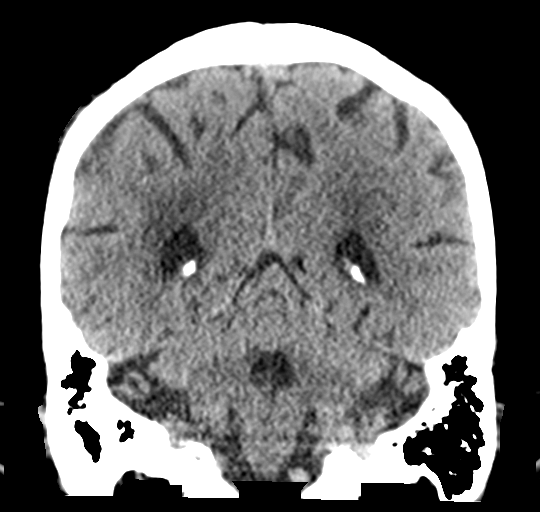
[im 29/65  brain]
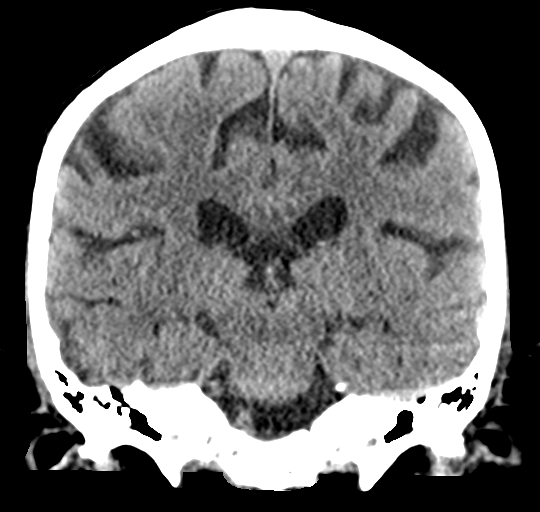
[im 36/65  brain]
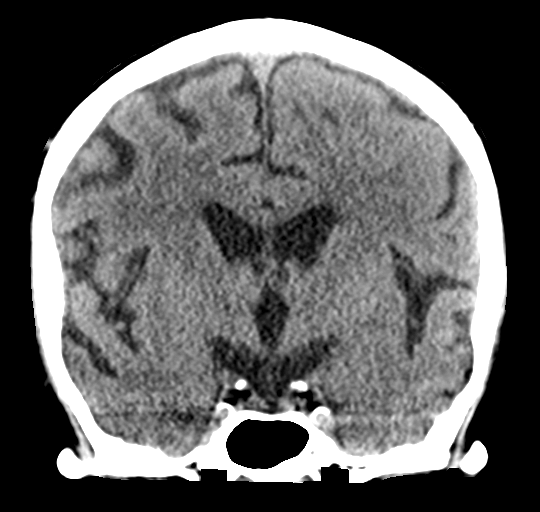

[Series 5: sagittal soft tissue · sagittal · 0.29mm/px · 3 of 52 slices shown]
[im 18/52  brain]
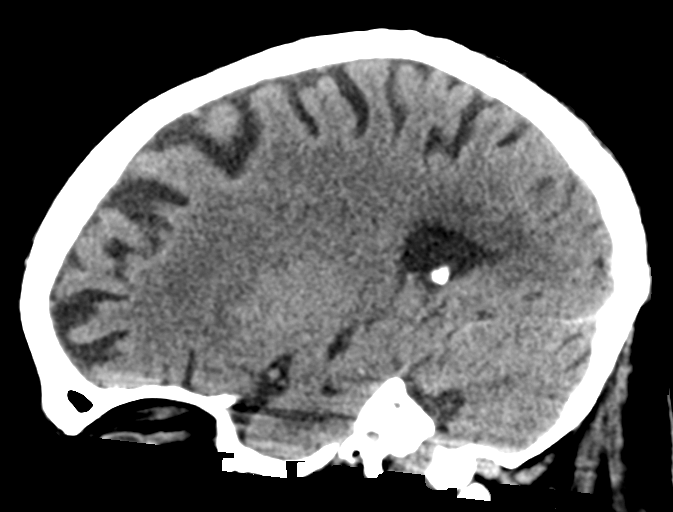
[im 26/52  brain]
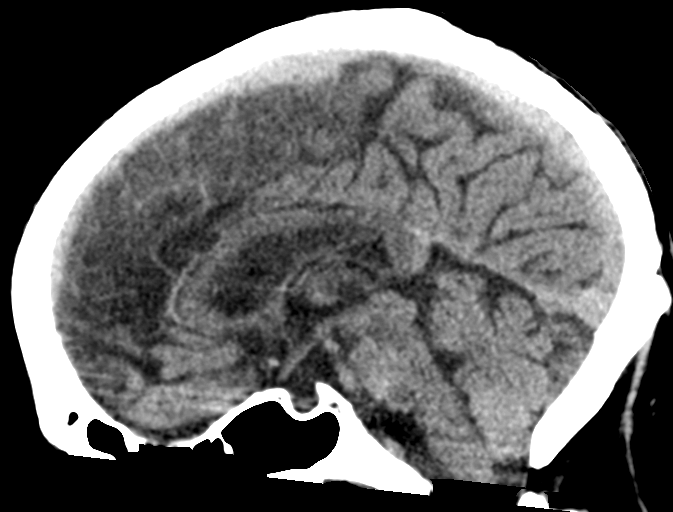
[im 35/52  brain]
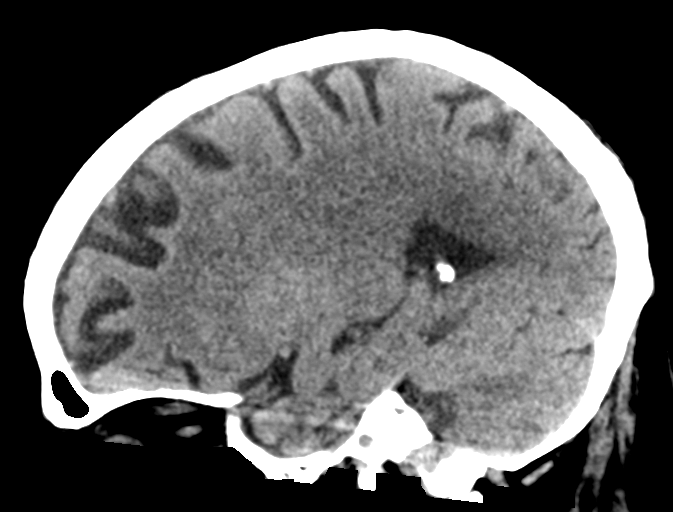

[15 of 46 positions shown; findings below may reference images not displayed]

FINDINGS: Brain: Ventricles, cisterns and other CSF spaces are within normal.
There is no mass, mass effect, shift of midline structures or acute
hemorrhage. No evidence to suggest acute infarction. There is mild
chronic ischemic microvascular disease and age related atrophic
change.

Vascular: No hyperdense vessel or unexpected calcification.

Skull: Normal. Negative for fracture or focal lesion.

Sinuses/Orbits: No acute finding.

Other: None.
IMPRESSION: 1.  No acute findings.

2. Mild chronic ischemic microvascular disease and mild age related
atrophic change.

## 2024-02-04 ENCOUNTER — Encounter: Payer: Self-pay | Admitting: Ophthalmology

## 2024-02-05 NOTE — Discharge Instructions (Signed)

## 2024-02-08 NOTE — Anesthesia Preprocedure Evaluation (Addendum)
 Anesthesia Evaluation  Patient identified by MRN, date of birth, ID band Patient awake    Reviewed: Allergy & Precautions, H&P , NPO status , Patient's Chart, lab work & pertinent test results  Airway Mallampati: III  TM Distance: <3 FB Neck ROM: Full   Comment: beard Dental no notable dental hx. (+) Edentulous Upper, Edentulous Lower   Pulmonary sleep apnea , Current Smoker   Pulmonary exam normal breath sounds clear to auscultation       Cardiovascular hypertension, Normal cardiovascular exam Rhythm:Regular Rate:Normal     Neuro/Psych negative neurological ROS  negative psych ROS   GI/Hepatic negative GI ROS, Neg liver ROS,,,  Endo/Other  diabetes    Renal/GU negative Renal ROS  negative genitourinary   Musculoskeletal negative musculoskeletal ROS (+)    Abdominal   Peds negative pediatric ROS (+)  Hematology negative hematology ROS (+)   Anesthesia Other Findings Hypercholesteremia  Diabetes mellitus without complication  Hypertension  Gout Sleep apnea  Type 2 diabetes mellitus with mild nonproliferative retinopathy without macular edema, without long-term current use of insulin , unspecified laterality (HCC)    Reproductive/Obstetrics negative OB ROS                             Anesthesia Physical Anesthesia Plan  ASA: 3  Anesthesia Plan: MAC   Post-op Pain Management:    Induction: Intravenous  PONV Risk Score and Plan:   Airway Management Planned: Natural Airway and Nasal Cannula  Additional Equipment:   Intra-op Plan:   Post-operative Plan:   Informed Consent: I have reviewed the patients History and Physical, chart, labs and discussed the procedure including the risks, benefits and alternatives for the proposed anesthesia with the patient or authorized representative who has indicated his/her understanding and acceptance.     Dental Advisory Given  Plan  Discussed with: Anesthesiologist, CRNA and Surgeon  Anesthesia Plan Comments: (Patient consented for risks of anesthesia including but not limited to:  - adverse reactions to medications - damage to eyes, teeth, lips or other oral mucosa - nerve damage due to positioning  - sore throat or hoarseness - Damage to heart, brain, nerves, lungs, other parts of body or loss of life  Patient voiced understanding and assent.)       Anesthesia Quick Evaluation

## 2024-02-09 ENCOUNTER — Encounter: Payer: Self-pay | Admitting: Ophthalmology

## 2024-02-09 ENCOUNTER — Ambulatory Visit: Payer: Self-pay | Admitting: Anesthesiology

## 2024-02-09 ENCOUNTER — Other Ambulatory Visit: Payer: Self-pay

## 2024-02-09 ENCOUNTER — Encounter
Admission: RE | Disposition: A | Discharge status: Home / Self Care | Payer: Self-pay | Source: Home / Self Care | Attending: Ophthalmology

## 2024-02-09 ENCOUNTER — Ambulatory Visit
Admission: RE | Admit: 2024-02-09 | Discharge: 2024-02-09 | Disposition: A | Discharge status: Home / Self Care | Attending: Ophthalmology | Admitting: Ophthalmology

## 2024-02-09 DIAGNOSIS — E1136 Type 2 diabetes mellitus with diabetic cataract: Secondary | ICD-10-CM | POA: Insufficient documentation

## 2024-02-09 DIAGNOSIS — H2511 Age-related nuclear cataract, right eye: Secondary | ICD-10-CM | POA: Diagnosis not present

## 2024-02-09 DIAGNOSIS — Z7984 Long term (current) use of oral hypoglycemic drugs: Secondary | ICD-10-CM | POA: Insufficient documentation

## 2024-02-09 DIAGNOSIS — G4730 Sleep apnea, unspecified: Secondary | ICD-10-CM | POA: Diagnosis not present

## 2024-02-09 DIAGNOSIS — I10 Essential (primary) hypertension: Secondary | ICD-10-CM | POA: Insufficient documentation

## 2024-02-09 DIAGNOSIS — F17290 Nicotine dependence, other tobacco product, uncomplicated: Secondary | ICD-10-CM | POA: Diagnosis not present

## 2024-02-09 HISTORY — PX: CATARACT EXTRACTION W/PHACO: SHX586

## 2024-02-09 HISTORY — DX: Sleep apnea, unspecified: G47.30

## 2024-02-09 HISTORY — DX: Type 2 diabetes mellitus with mild nonproliferative diabetic retinopathy without macular edema, unspecified eye: E11.3299

## 2024-02-09 LAB — GLUCOSE, CAPILLARY: Glucose-Capillary: 155 mg/dL — ABNORMAL HIGH (ref 70–99)

## 2024-02-09 SURGERY — PHACOEMULSIFICATION, CATARACT, WITH IOL INSERTION
Anesthesia: Monitor Anesthesia Care | Site: Eye | Laterality: Right

## 2024-02-09 MED ORDER — MIDAZOLAM HCL 2 MG/2ML IJ SOLN
INTRAMUSCULAR | Status: AC
  Filled 2024-02-09: qty 2

## 2024-02-09 MED ORDER — BRIMONIDINE TARTRATE-TIMOLOL 0.2-0.5 % OP SOLN
OPHTHALMIC | Status: DC | PRN
  Administered 2024-02-09: 1 [drp] via OPHTHALMIC

## 2024-02-09 MED ORDER — ARMC OPHTHALMIC DILATING DROPS
OPHTHALMIC | Status: AC
  Filled 2024-02-09: qty 0.5

## 2024-02-09 MED ORDER — LIDOCAINE HCL (PF) 2 % IJ SOLN
INTRAOCULAR | Status: DC | PRN
  Administered 2024-02-09: 1 mL

## 2024-02-09 MED ORDER — SIGHTPATH DOSE#1 BSS IO SOLN
INTRAOCULAR | Status: DC | PRN
  Administered 2024-02-09 (×2): 15 mL

## 2024-02-09 MED ORDER — TETRACAINE HCL 0.5 % OP SOLN
1.0000 [drp] | OPHTHALMIC | Status: DC | PRN
  Administered 2024-02-09 (×4): 1 [drp] via OPHTHALMIC

## 2024-02-09 MED ORDER — TETRACAINE HCL 0.5 % OP SOLN
OPHTHALMIC | Status: AC
Start: 2024-02-09 — End: ?
  Filled 2024-02-09: qty 4

## 2024-02-09 MED ORDER — MOXIFLOXACIN HCL 0.5 % OP SOLN
OPHTHALMIC | Status: DC | PRN
  Administered 2024-02-09: .2 mL via OPHTHALMIC

## 2024-02-09 MED ORDER — PHENYLEPHRINE HCL 10 % OP SOLN
1.0000 [drp] | OPHTHALMIC | Status: AC
  Administered 2024-02-09 (×2): 1 [drp] via OPHTHALMIC

## 2024-02-09 MED ORDER — PHENYLEPHRINE HCL 10 % OP SOLN
OPHTHALMIC | Status: AC
  Filled 2024-02-09: qty 5

## 2024-02-09 MED ORDER — SIGHTPATH DOSE#1 BSS IO SOLN
INTRAOCULAR | Status: DC | PRN
  Administered 2024-02-09: 102 mL via OPHTHALMIC

## 2024-02-09 MED ORDER — FENTANYL CITRATE (PF) 100 MCG/2ML IJ SOLN
INTRAMUSCULAR | Status: DC | PRN
  Administered 2024-02-09 (×4): 25 ug via INTRAVENOUS

## 2024-02-09 MED ORDER — NALOXONE HCL 0.4 MG/ML IJ SOLN
0.4000 mg | INTRAMUSCULAR | Status: DC | PRN
  Administered 2024-02-09: 0.4 mg via INTRAVENOUS

## 2024-02-09 MED ORDER — LACTATED RINGERS IV SOLN: INTRAVENOUS | Status: DC

## 2024-02-09 MED ORDER — SIGHTPATH DOSE#1 NA CHONDROIT SULF-NA HYALURON 40-17 MG/ML IO SOLN
INTRAOCULAR | Status: DC | PRN
  Administered 2024-02-09 (×2): 1 mL via INTRAOCULAR

## 2024-02-09 MED ORDER — ARMC OPHTHALMIC DILATING DROPS
1.0000 | OPHTHALMIC | Status: DC | PRN
  Administered 2024-02-09 (×3): 1 via OPHTHALMIC

## 2024-02-09 MED ORDER — MIDAZOLAM HCL 2 MG/2ML IJ SOLN
INTRAMUSCULAR | Status: DC | PRN
Start: 2024-02-09 — End: 2024-02-09
  Administered 2024-02-09 (×4): .5 mg via INTRAVENOUS

## 2024-02-09 MED ORDER — NALOXONE NEWBORN-WH INJECTION 0.4 MG/ML: 0.4000 mg | Freq: Once | INTRAMUSCULAR | Status: DC

## 2024-02-09 MED ORDER — NALOXONE HCL 0.4 MG/ML IJ SOLN
INTRAMUSCULAR | Status: AC
  Filled 2024-02-09: qty 1

## 2024-02-09 MED ORDER — FENTANYL CITRATE (PF) 100 MCG/2ML IJ SOLN
INTRAMUSCULAR | Status: AC
  Filled 2024-02-09: qty 2

## 2024-02-09 SURGICAL SUPPLY — 14 items
CATARACT SUITE SIGHTPATH (MISCELLANEOUS) ×1 IMPLANT
CYSTOTOME ANGL RVRS SHRT 25G (CUTTER) ×1 IMPLANT
CYSTOTOME ANGL RVRS SHRT 25GA (CUTTER) ×1 IMPLANT
DEVICE MILOOP (MISCELLANEOUS) IMPLANT
FEE CATARACT SUITE SIGHTPATH (MISCELLANEOUS) ×1 IMPLANT
GLOVE BIOGEL PI IND STRL 8 (GLOVE) ×1 IMPLANT
GLOVE SURG LX STRL 8.0 MICRO (GLOVE) ×1 IMPLANT
GLOVE SURG PROTEXIS BL SZ6.5 (GLOVE) ×1 IMPLANT
GLOVE SURG SYN 6.5 PF PI BL (GLOVE) ×1 IMPLANT
LENS IOL TECNIS EYHANCE 24.5 (Intraocular Lens) IMPLANT
NDL FILTER BLUNT 18X1 1/2 (NEEDLE) ×1 IMPLANT
NEEDLE FILTER BLUNT 18X1 1/2 (NEEDLE) ×1 IMPLANT
RING MALYGIN (MISCELLANEOUS) IMPLANT
SYR 3ML LL SCALE MARK (SYRINGE) ×1 IMPLANT

## 2024-02-09 NOTE — H&P (Signed)
 [facility]   Primary Care Physician:  Sina Dudley, MD Ophthalmologist: Dr. Jeb Miner  Pre-Procedure History & Physical: HPI:  Gregory Miller is a 78 y.o. male here for cataract surgery.   Past Medical History:  Diagnosis Date   Diabetes mellitus without complication (HCC)    Gout    Hypercholesteremia    Hypertension    Sleep apnea    has CPAP, doesn't use   Type 2 diabetes mellitus with mild nonproliferative retinopathy without macular edema, without long-term current use of insulin , unspecified laterality [facility])     Past Surgical History:  Procedure Laterality Date   HERNIA REPAIR      Prior to Admission medications   Medication Sig Start Date End Date Taking? Authorizing Provider  aspirin  81 MG EC tablet Take by mouth. 12/14/07  Yes [provider]  Cholecalciferol 50 MCG (2000 UT) TABS Take 2,000 Units by mouth daily.    Yes [provider]  colchicine  0.6 MG tablet Take 0.6 mg by mouth daily.   Yes [provider]  diazepam  (VALIUM ) 2 MG tablet Take 1 tablet (2 mg total) by mouth every 8 (eight) hours as needed for muscle spasms. 12/25/15  Yes Deeanna Farm L, PA-C  furosemide (LASIX) 20 MG tablet Take 20 mg by mouth daily.   Yes [provider]  glipiZIDE  (GLUCOTROL ) 5 MG tablet Take 10 mg by mouth daily before breakfast.    Yes [provider]  linagliptin  (TRADJENTA ) 5 MG TABS tablet Take 1 tablet (5 mg total) by mouth daily. 01/13/20  Yes Darus Engels A, DO  lisinopril (ZESTRIL) 2.5 MG tablet Take 2.5 mg by mouth daily.   Yes [provider]  metFORMIN  (GLUCOPHAGE ) 1000 MG tablet Take 1,000 mg by mouth 2 (two) times daily with a meal.   Yes [provider]  simvastatin  (ZOCOR ) 40 MG tablet Take 40 mg by mouth daily.   Yes [provider]  tamsulosin  (FLOMAX ) 0.4 MG CAPS capsule Take 1 capsule (0.4 mg total) by mouth daily after supper. 01/12/20  Yes Darus Engels A, DO    Allergies as of  01/22/2024   (No Known Allergies)    Family History  Family history unknown: Yes    Social History   Socioeconomic History   Marital status: Married    Spouse name: Not on file   Number of children: Not on file   Years of education: Not on file   Highest education level: Not on file  Occupational History   Not on file  Tobacco Use   Smoking status: Some Days    Types: Cigars   Smokeless tobacco: Never  Substance and Sexual Activity   Alcohol use: No   Drug use: No   Sexual activity: Not on file  Other Topics Concern   Not on file  Social History Narrative   Not on file   Social Drivers of Health   Financial Resource Strain: Low Risk  (09/29/2022)   Received from [facility]   Overall Financial Resource Strain (CARDIA)    Difficulty of Paying Living Expenses: Not hard at all  Food Insecurity: No Food Insecurity (09/29/2022)   [facility] from [facility]   Hunger Vital Sign    Worried About Running Out of Food in the Last Year: Never true    Ran Out of Food in the Last Year: Never true  Transportation Needs: No Transportation Needs (09/29/2022)   Received from [facility]   PRAPARE -  Administrator, Civil Service (Medical): No    Lack of Transportation (Non-Medical): No  Physical Activity: Not on file  Stress: Not on file  Social Connections: Not on file  Intimate Partner Violence: Not At Risk (03/25/2021)   Received from [facility]   Humiliation, Afraid, Rape, and Kick questionnaire    Fear of Current or Ex-Partner: No    Emotionally Abused: No    Physically Abused: No    Sexually Abused: No    Review of Systems: See HPI, otherwise negative ROS  Physical Exam: BP 123/74   Temp 98.1 F (36.7 C) (Temporal)   Resp 19   Ht 4' 11.02" (1.499 m)   Wt 62.3 kg   SpO2 96%   BMI 27.72 kg/m  General:   Alert, cooperative. Head:  Normocephalic and atraumatic. Respiratory:  Normal work of breathing. Cardiovascular:   NAD  Impression/Plan: Gregory Miller is here for cataract surgery.  Risks, benefits, limitations, and alternatives regarding cataract surgery have been reviewed with the patient.  Questions have been answered.  All parties agreeable.   Clair Crews, MD  02/09/2024, 8:24 AM

## 2024-02-09 NOTE — Op Note (Signed)
 PREOPERATIVE DIAGNOSIS:  Nuclear sclerotic cataract of the right eye.   POSTOPERATIVE DIAGNOSIS:  Right Eye Cataract   OPERATIVE PROCEDURE:ORPROCALL@   SURGEON:  Clair Crews, MD.   ANESTHESIA:  Anesthesiologist: Emilie Harden, MD CRNA: Sherrlyn Dolores, CRNA; Coley Davis, CRNA  1.      Managed anesthesia care. 2.      0.28ml of Shugarcaine was instilled in the eye following the paracentesis.   COMPLICATIONS: Vision Blue was used to stain the anterior capsule due to very poor/ no visualization of the red reflex. Viscoelastic was used to raise the pupil margin.  A  Malyugin ring was placed as the pupil would not achieve sufficient pharmacologic dilation to undergo cataract extraction safely.( The ring was removed atraumatically following insertion of the IOL.)    TECHNIQUE:   Stop and chop, Miloop assisted   DESCRIPTION OF PROCEDURE:  The patient was examined and consented in the preoperative holding area where the aforementioned topical anesthesia was applied to the right eye and then brought back to the Operating Room where the right eye was prepped and draped in the usual sterile ophthalmic fashion and a lid speculum was placed. A paracentesis was created with the side port blade and the anterior chamber was filled with viscoelastic. A near clear corneal incision was performed with the steel keratome. A continuous curvilinear capsulorrhexis was performed with a cystotome followed by the capsulorrhexis forceps. Hydrodissection and hydrodelineation were carried out with BSS on a blunt cannula. Following dividing the lens with the MiLoop, The lens was removed in a stop and chop  technique and the remaining cortical material was removed with the irrigation-aspiration handpiece. The capsular bag was inflated with viscoelastic and the Technis [id number]  lens was placed in the capsular bag without complication. The remaining viscoelastic was removed from the eye with the irrigation-aspiration  handpiece. The wounds were hydrated. The anterior chamber was flushed with BSS and the eye was inflated to physiologic pressure. 0.1ml of Vigamox was placed in the anterior chamber. The wounds were found to be water tight. The eye was dressed with Combigan. The patient was given protective glasses to wear throughout the day and a shield with which to sleep tonight. The patient was also given drops with which to begin a drop regimen today and will follow-up with me in one day. Implant Name Type Inv. Item Serial No. Manufacturer Lot No. LRB No. Used Action  LENS IOL TECNIS EYHANCE 24.5 - [id number] Intraocular Lens LENS IOL TECNIS EYHANCE 24.5 [id number] [employer]  Right 1 Implanted   Procedure(s): PHACOEMULSIFICATION, CATARACT, WITH IOL INSERTION 29.00 02:26.1 (Right)  Electronically signed: Clair Crews 02/09/2024 10:05 AM

## 2024-02-09 NOTE — Transfer of Care (Signed)
 Immediate Anesthesia Transfer of Care Note  Patient: Gregory Miller  Procedure(s) Performed: PHACOEMULSIFICATION, CATARACT, WITH IOL INSERTION 29.00 02:26.1 (Right: Eye)  Patient Location: PACU  Anesthesia Type: MAC  Level of Consciousness: awake, alert  and patient cooperative  Airway and Oxygen Therapy: Patient Spontanous Breathing and Patient connected to supplemental oxygen  Post-op Assessment: Post-op Vital signs reviewed, Patient's Cardiovascular Status Stable, Respiratory Function Stable, Patent Airway and No signs of Nausea or vomiting  Post-op Vital Signs: Reviewed and stable  Complications: No notable events documented.

## 2024-02-09 NOTE — Anesthesia Postprocedure Evaluation (Signed)
 Anesthesia Post Note  Patient: Gregory Miller  Procedure(s) Performed: PHACOEMULSIFICATION, CATARACT, WITH IOL INSERTION 29.00 02:26.1 (Right: Eye)  Patient location during evaluation: PACU Anesthesia Type: MAC Level of consciousness: awake and alert Pain management: pain level controlled Vital Signs Assessment: post-procedure vital signs reviewed and stable Respiratory status: spontaneous breathing, nonlabored ventilation, respiratory function stable and patient connected to nasal cannula oxygen Cardiovascular status: stable and blood pressure returned to baseline Postop Assessment: no apparent nausea or vomiting Anesthetic complications: no   No notable events documented.   Last Vitals:  Vitals:   02/09/24 1006 02/09/24 1030  BP: 114/73 127/78  Pulse: 79 74  Resp: (!) 22 (!) 21  Temp: 36.4 C 36.4 C  SpO2: 95% 96%    Last Pain:  Vitals:   02/09/24 1030  TempSrc:   PainSc: 0-No pain                 [name]

## 2024-02-23 ENCOUNTER — Ambulatory Visit: Admit: 2024-02-23 | Admitting: Ophthalmology

## 2024-02-23 SURGERY — PHACOEMULSIFICATION, CATARACT, WITH IOL INSERTION
Anesthesia: Topical | Laterality: Left
